# Patient Record
Sex: Female | Born: 1990 | Race: White | Hispanic: No | Marital: Married | Smoking: Former smoker
Health system: Southern US, Community
[De-identification: ages and names within clinical notes are randomized; demographics above are authoritative.]

## PROBLEM LIST (undated history)

## (undated) DIAGNOSIS — G8929 Other chronic pain: Secondary | ICD-10-CM

## (undated) DIAGNOSIS — M549 Dorsalgia, unspecified: Secondary | ICD-10-CM

## (undated) HISTORY — PX: WISDOM TOOTH EXTRACTION: SHX21

---

## 2009-10-14 ENCOUNTER — Inpatient Hospital Stay: Payer: Self-pay

## 2011-12-09 ENCOUNTER — Ambulatory Visit: Payer: Self-pay

## 2012-03-07 ENCOUNTER — Ambulatory Visit: Payer: Self-pay | Admitting: Unknown Physician Specialty

## 2012-03-15 ENCOUNTER — Other Ambulatory Visit: Payer: Self-pay | Admitting: Neurosurgery

## 2012-03-15 ENCOUNTER — Encounter (HOSPITAL_COMMUNITY): Payer: Self-pay | Admitting: Pharmacy Technician

## 2012-03-22 ENCOUNTER — Encounter (HOSPITAL_COMMUNITY): Payer: Self-pay

## 2012-03-22 ENCOUNTER — Encounter (HOSPITAL_COMMUNITY)
Admission: RE | Admit: 2012-03-22 | Discharge: 2012-03-22 | Disposition: A | Discharge status: Home / Self Care | Payer: Medicaid Other | Source: Ambulatory Visit | Attending: Neurosurgery | Admitting: Neurosurgery

## 2012-03-22 DIAGNOSIS — Z538 Procedure and treatment not carried out for other reasons: Secondary | ICD-10-CM | POA: Insufficient documentation

## 2012-03-22 DIAGNOSIS — Z01818 Encounter for other preprocedural examination: Secondary | ICD-10-CM | POA: Insufficient documentation

## 2012-03-22 DIAGNOSIS — Z01812 Encounter for preprocedural laboratory examination: Secondary | ICD-10-CM | POA: Insufficient documentation

## 2012-03-22 HISTORY — DX: Other chronic pain: G89.29

## 2012-03-22 HISTORY — DX: Other chronic pain: M54.9

## 2012-03-22 LAB — CBC
MCH: 30 pg (ref 26.0–34.0)
MCHC: 34.5 g/dL (ref 30.0–36.0)
Platelets: 259 10*3/uL (ref 150–400)
RDW: 13 % (ref 11.5–15.5)

## 2012-03-22 LAB — SURGICAL PCR SCREEN
MRSA, PCR: NEGATIVE
Staphylococcus aureus: NEGATIVE

## 2012-03-22 LAB — HCG, SERUM, QUALITATIVE: Preg, Serum: NEGATIVE

## 2012-03-22 NOTE — Pre-Procedure Instructions (Signed)
[address]  03/22/2012   Your procedure is scheduled on:  Tues, Oct 22 @ 2:15 PM  Report to [facility] at 11:15 AM.  Call this number if you have problems the morning of surgery: [phone]   Remember:   Do not eat food:After Midnight.  Take these medicines the morning of surgery with A SIP OF WATER: Pain Pill(if needed)   Do not wear jewelry, make-up or nail polish.  Do not wear lotions, powders, or perfumes. You may wear deodorant.  Do not shave 48 hours prior to surgery.   Do not bring valuables to the hospital.  Contacts, dentures or bridgework may not be worn into surgery.  Leave suitcase in the car. After surgery it may be brought to your room.  For patients admitted to the hospital, checkout time is 11:00 AM the day of discharge.   Patients discharged the day of surgery will not be allowed to drive home.  Special Instructions: Shower using CHG 2 nights before surgery and the night before surgery.  If you shower the day of surgery use CHG.  Use special wash - you have one bottle of CHG for all showers.  You should use approximately 1/3 of the bottle for each shower.   Please read over the following fact sheets that you were given: Pain Booklet, Coughing and Deep Breathing, MRSA Information and Surgical Site Infection Prevention

## 2012-03-22 NOTE — Progress Notes (Signed)
Pt doesn't have a  Cardiologist  Denies ever having an echo/heart cath/stress test   [facility] in [place]  Denies ekg or cxr

## 2012-03-26 MED ORDER — CEFAZOLIN SODIUM 10 G IJ SOLR
3.0000 g | INTRAMUSCULAR | Status: DC
  Filled 2012-03-26: qty 3000

## 2012-03-26 NOTE — H&P (Signed)
NEUROSURGICAL CONSULTATION  Theodis Aguas. Caralee Ates  #[id number]  DOB:  [birth year]    March 14, 2012   HISTORY OF PRESENT ILLNESS:  Rebekah Parks is a 21 year old woman who works as a part-time Arts administrator who complains of right leg pain radiating to her right ankle.  She says this has gradually been getting worse.  It began 10/2011.  She said she fell on 10/17/2011 on her right hip and now she says that driving causes her increased pain.  She says she has been taking Hydrocodone 7.5/500 up to 7 to 10 per day and is still not getting relief.  She says that after she fell in May that a week later she started having severe pain and this has gradually worsened.  She comes today with an MRI of her lumbar spine which was performed through [facility] on October 2nd, 2013 which shows a large right paracentral L5-S1 disc herniation with a large sequestered fragment and high-grade spinal stenosis.    Currently she denies bowel or bladder dysfunction.    REVIEW OF SYSTEMS:   Review of Systems was reviewed with the patient.  Pertinent positives include Cardiovascular - she notes leg pain with walking; Musculoskeletal - she notes leg pain.    PAST MEDICAL HISTORY:      Medications and Allergies:  Hydrocodone as previously described.  No known drug allergies.      Height and Weight:  She is 5', 5" tall and 210 lbs.  BMI is 34.9.    FAMILY HISTORY:    Mother was born in 54.  She has high blood pressure.  Father was born in 85.  He is otherwise healthy.    SOCIAL HISTORY:    She smokes 1  packs of cigarettes per day.  She is a social drinker of alcoholic beverages.  She denies any drug use.    PHYSICAL EXAMINATION:      General Appearance:  [name] is a pleasant, cooperative woman in no acute distress.      Blood Pressure, Pulse and Respiratory Rate:  Her blood pressure is 132/70.  Heart rate is 76 and regular.  Respiratory rate is 18.        HEENT - normocephalic, atraumatic.  The  pupils are equal, round and reactive to light.  The extraocular muscles are intact.  Sclerae - white.  Conjunctiva - pink.  Oropharynx benign.  Uvula midline.     Neck - there are no masses, meningismus, deformities, tracheal deviation, jugular vein distention or carotid bruits.  There is normal cervical range of motion.  Spurlings' test is negative without reproducible radicular pain turning the patient's head to either side.  Lhermitte's sign is not present with axial compression.      Respiratory - there is normal respiratory effort with good intercostal function.  Lungs are clear to auscultation.  There are no rales, rhonchi or wheezes.      Cardiovascular - the heart has regular rate and rhythm to auscultation.  No murmurs are appreciated.  There is no extremity edema, cyanosis or clubbing.  There are palpable pedal pulses.      Abdomen - obese, soft, nontender, no hepatosplenomegaly appreciated or masses.  There are active bowel sounds.  No guarding or rebound.      Musculoskeletal Examination - she has right sciatic notch discomfort.  She is able to bend to within 10 degrees off of vertical before she has significant back and right leg pain.  She is able  to stand on her heels and toes, but has decreased dorsiflexion on the right secondary to pain.  She has straight leg raise at 30 degrees on the right and positive cross straight leg raise on the left for right lower extremity pain.    NEUROLOGICAL EXAMINATION: The patient is oriented to time, person and place and has good recall of both recent and remote memory with normal attention span and concentration.  The patient speaks with clear and fluent speech and exhibits normal language function and appropriate fund of knowledge.      Cranial Nerve Examination - pupils are equal, round and reactive to light.  Extraocular movements are full.  Visual fields are full to confrontational testing.  Facial sensation and facial movement are symmetric and  intact.  Hearing is intact to finger rub.  Palate is upgoing.  Shoulder shrug is symmetric.  Tongue protrudes in the midline.      Motor Examination - motor strength is 5/5 in the bilateral deltoids, biceps, triceps, handgrips, wrist extensors, interosseous.  In the lower extremities motor strength is 5/5 with the exception of right EHL strength at 4/5.        Sensory Examination - normal to light touch and pinprick sensation in the upper and lower extremities.     Deep Tendon Reflexes - 2 in the biceps, triceps and brachioradialis, 2 at the knees, absent at the right ankle, 2 at the left ankle.  Great toes are downgoing to plantar stimulation.      Cerebellar Examination - normal coordination in upper and lower extremities and normal rapid alternating movements.  Romberg test is negative.    IMPRESSION AND RECOMMENDATIONS:   Rebekah Parks is a 21 year old woman with a large disc herniation at the L5-S1 level on the right.  I have recommended that based on the severity of her pain, weakness and imaging findings that she undergo expedited right L5-S1 microdiskectomy.  This has been scheduled for 03/27/2012.  Risks and benefits were discussed with the patient in detail and she wishes to proceed.    I reviewed the studies with the patient and went over her physical examination.  I reviewed surgical models and discussed the typical hospital course and operative and postoperative course and the potential risks and benefits of surgery.  The risks of surgery were discussed in detail and include, but are not limited to, the risks of anesthesia, blood loss and the possibility of hemorrhage, infection, damage to nerves, damage to blood vessels, injury to the lumbar nerve root causing either temporary or permanent leg pain, numbness, weakness.  There is potential for spinal fluid leak from dural tear.  There is the potential for post-laminectomy spondylolisthesis, recurrent disc ruptured quoted at approximately  10%, failure to relieve pain, worsening of pain, need for further surgery.    [facility]    Danae Orleans. Venetia Maxon, M.D.  JDS:sv cc: Dr. Erin Sons  Dr. Clayborn Bigness

## 2012-03-27 ENCOUNTER — Ambulatory Visit (HOSPITAL_COMMUNITY): Admission: RE | Admit: 2012-03-27 | Payer: Medicaid Other | Source: Ambulatory Visit | Admitting: Neurosurgery

## 2012-03-27 ENCOUNTER — Encounter (HOSPITAL_COMMUNITY): Admission: RE | Payer: Self-pay | Source: Ambulatory Visit

## 2012-03-27 SURGERY — LUMBAR LAMINECTOMY/DECOMPRESSION MICRODISCECTOMY 1 LEVEL
Anesthesia: General | Site: Back | Laterality: Right

## 2012-03-27 NOTE — Interval H&P Note (Signed)
History and Physical Interval Note:  03/27/2012 6:29 AM  Rebekah Parks  has presented today for surgery, with the diagnosis of Lumbar hnp without myelopathy, Lumbar spondylosis, Lumbar degenerative disc disease, Lumbar radiculopathy  The various methods of treatment have been discussed with the patient and family. After consideration of risks, benefits and other options for treatment, the patient has consented to  Procedure(s) (LRB) with comments: LUMBAR LAMINECTOMY/DECOMPRESSION MICRODISCECTOMY 1 LEVEL (Right) - Right L5-S1 Microdiskectomy as a surgical intervention .  The patient's history has been reviewed, patient examined, no change in status, stable for surgery.  I have reviewed the patient's chart and labs.  Questions were answered to the patient's satisfaction.     Rebekah Parks  Date of Initial H&P: 03/26/2012  History reviewed, patient examined, no change in status, stable for surgery.

## 2012-04-11 ENCOUNTER — Encounter: Payer: Self-pay | Admitting: Family Medicine

## 2012-05-06 ENCOUNTER — Encounter: Payer: Self-pay | Admitting: Family Medicine

## 2013-01-01 ENCOUNTER — Emergency Department: Payer: Self-pay | Admitting: Emergency Medicine

## 2013-01-04 LAB — BETA STREP CULTURE(ARMC)

## 2013-08-29 ENCOUNTER — Encounter: Payer: Self-pay | Admitting: Maternal & Fetal Medicine

## 2013-10-22 DIAGNOSIS — F1111 Opioid abuse, in remission: Secondary | ICD-10-CM | POA: Insufficient documentation

## 2013-11-21 DIAGNOSIS — E669 Obesity, unspecified: Secondary | ICD-10-CM | POA: Insufficient documentation

## 2013-11-21 DIAGNOSIS — E66812 Obesity, class 2: Secondary | ICD-10-CM | POA: Insufficient documentation

## 2015-03-12 ENCOUNTER — Ambulatory Visit
Admission: EM | Admit: 2015-03-12 | Discharge: 2015-03-12 | Disposition: A | Discharge status: Home / Self Care | Payer: Medicaid Other | Attending: Family Medicine | Admitting: Family Medicine

## 2015-03-12 DIAGNOSIS — Z283 Underimmunization status: Secondary | ICD-10-CM | POA: Diagnosis not present

## 2015-03-12 DIAGNOSIS — T148XXA Other injury of unspecified body region, initial encounter: Secondary | ICD-10-CM

## 2015-03-12 DIAGNOSIS — S6981XA Other specified injuries of right wrist, hand and finger(s), initial encounter: Secondary | ICD-10-CM | POA: Diagnosis not present

## 2015-03-12 DIAGNOSIS — Z7721 Contact with and (suspected) exposure to potentially hazardous body fluids: Secondary | ICD-10-CM | POA: Insufficient documentation

## 2015-03-12 DIAGNOSIS — S61239A Puncture wound without foreign body of unspecified finger without damage to nail, initial encounter: Secondary | ICD-10-CM

## 2015-03-12 DIAGNOSIS — F17200 Nicotine dependence, unspecified, uncomplicated: Secondary | ICD-10-CM | POA: Insufficient documentation

## 2015-03-12 DIAGNOSIS — Z2839 Other underimmunization status: Secondary | ICD-10-CM

## 2015-03-12 DIAGNOSIS — W273XXA Contact with needle (sewing), initial encounter: Secondary | ICD-10-CM

## 2015-03-12 DIAGNOSIS — T148 Other injury of unspecified body region: Secondary | ICD-10-CM | POA: Diagnosis not present

## 2015-03-12 DIAGNOSIS — IMO0001 Reserved for inherently not codable concepts without codable children: Secondary | ICD-10-CM

## 2015-03-12 LAB — COMPREHENSIVE METABOLIC PANEL
ALT: 16 U/L (ref 14–54)
AST: 18 U/L (ref 15–41)
Albumin: 4.1 g/dL (ref 3.5–5.0)
Alkaline Phosphatase: 58 U/L (ref 38–126)
Anion gap: 3 — ABNORMAL LOW (ref 5–15)
BUN: 7 mg/dL (ref 6–20)
CHLORIDE: 102 mmol/L (ref 101–111)
CO2: 32 mmol/L (ref 22–32)
Calcium: 9 mg/dL (ref 8.9–10.3)
Creatinine, Ser: 0.64 mg/dL (ref 0.44–1.00)
Glucose, Bld: 109 mg/dL — ABNORMAL HIGH (ref 65–99)
POTASSIUM: 3.8 mmol/L (ref 3.5–5.1)
SODIUM: 137 mmol/L (ref 135–145)
Total Bilirubin: 0.4 mg/dL (ref 0.3–1.2)
Total Protein: 6.8 g/dL (ref 6.5–8.1)

## 2015-03-12 LAB — PREGNANCY, URINE: PREG TEST UR: NEGATIVE

## 2015-03-12 LAB — CBC WITH DIFFERENTIAL/PLATELET
BASOS ABS: 0 10*3/uL (ref 0–0.1)
Basophils Relative: 1 %
EOS ABS: 0.2 10*3/uL (ref 0–0.7)
EOS PCT: 2 %
HCT: 40.3 % (ref 35.0–47.0)
Hemoglobin: 13.7 g/dL (ref 12.0–16.0)
LYMPHS ABS: 3.9 10*3/uL — AB (ref 1.0–3.6)
LYMPHS PCT: 46 %
MCH: 28.9 pg (ref 26.0–34.0)
MCHC: 34 g/dL (ref 32.0–36.0)
MCV: 84.9 fL (ref 80.0–100.0)
MONO ABS: 0.3 10*3/uL (ref 0.2–0.9)
Monocytes Relative: 4 %
Neutro Abs: 4 10*3/uL (ref 1.4–6.5)
Neutrophils Relative %: 47 %
PLATELETS: 261 10*3/uL (ref 150–440)
RBC: 4.74 MIL/uL (ref 3.80–5.20)
RDW: 13 % (ref 11.5–14.5)
WBC: 8.5 10*3/uL (ref 3.6–11.0)

## 2015-03-12 MED ORDER — EMTRICITABINE-TENOFOVIR DF 200-300 MG PO TABS: 1.0000 | ORAL_TABLET | Freq: Every day | ORAL | Status: DC

## 2015-03-12 MED ORDER — MUPIROCIN 2 % EX OINT: 1.0000 "application " | TOPICAL_OINTMENT | Freq: Three times a day (TID) | CUTANEOUS | Status: DC

## 2015-03-12 MED ORDER — DOLUTEGRAVIR SODIUM 50 MG PO TABS: 50.0000 mg | ORAL_TABLET | Freq: Every day | ORAL | Status: DC

## 2015-03-12 MED ORDER — TETANUS-DIPHTH-ACELL PERTUSSIS 5-2.5-18.5 LF-MCG/0.5 IM SUSP
0.5000 mL | Freq: Once | INTRAMUSCULAR | Status: AC
  Administered 2015-03-12: 0.5 mL via INTRAMUSCULAR

## 2015-03-12 NOTE — ED Provider Notes (Signed)
CSN: [id number]     Arrival date & time 03/12/15  1754 History   First MD Initiated Contact with Patient 03/12/15 1854    Nurse's notes were reviewed. Chief Complaint  Patient presents with  . Body Fluid Exposure   (Consider location/radiation/quality/duration/timing/severity/associated sxs/prior Treatment) Patient is a 24 y.o. female presenting with wound check. The history is provided by the patient. No language interpreter was used.  Wound Check This is a new problem. The current episode started 3 to 5 hours ago. The problem has not changed since onset.Pertinent negatives include no chest pain, no abdominal pain, no headaches and no shortness of breath. Nothing aggravates the symptoms. Nothing relieves the symptoms. She has tried nothing for the symptoms.   Patient is here after trying to take a needle away from a child she was stuck with a needle instead. The needle and syringe was insulin needle. No fluid in the needle was visible. The needle instruments found outside. Patient capped the needle immediately. She was wearing thick latex gloves at the time did receive a puncture wound to the web of her hand.  Initially she was not sure of her hepatitis B vaccination status but did find out that she did have the vaccination him when she had a baby they would've re-immunize her if she needed it at that time which she did. Her tetanus last that she knows of was 2002 since she will need to have a tetanus . Past Medical History  Diagnosis Date  . Chronic back pain     HNP,spondylosis/DDD/radiculopathy   Past Surgical History  Procedure Laterality Date  . Wisdom tooth extraction     No family history on file. Social History  Substance Use Topics  . Smoking status: Current Every Day Smoker -- 1.00 packs/day for 5 years  . Smokeless tobacco: Not on file  . Alcohol Use: No   OB History    No data available     Review of Systems  Respiratory: Negative for shortness of breath.    Cardiovascular: Negative for chest pain.  Gastrointestinal: Negative for abdominal pain.  Skin:       Puncture wound of the web of the right hand  Neurological: Negative for headaches.  All other systems reviewed and are negative.   Allergies  Review of patient's allergies indicates no known allergies.  Home Medications   Prior to Admission medications   Medication Sig Start Date End Date Taking? Authorizing Provider  buprenorphine-naloxone (SUBOXONE) 8-2 MG SUBL SL tablet Place 2.5 tablets under the tongue 2 (two) times daily.   Yes Historical Provider, MD  dolutegravir (TIVICAY) 50 MG tablet Take 1 tablet (50 mg total) by mouth daily. 03/12/15   Frederich Cha, MD  emtricitabine-tenofovir (TRUVADA) 200-300 MG tablet Take 1 tablet by mouth daily. TWO DRUG ADMINISTRATION NEED TO HAVE RETESTING IN TWO WEEKS 03/12/15 04/12/15  Frederich Cha, MD  mupirocin ointment (BACTROBAN) 2 % Apply 1 application topically 3 (three) times daily. 03/12/15   Frederich Cha, MD  oxyCODONE-acetaminophen (PERCOCET) 10-325 MG per tablet Take 1 tablet by mouth every 4 (four) hours as needed. For pain    Historical Provider, MD   Meds Ordered and Administered this Visit   Medications  Tdap (BOOSTRIX) injection 0.5 mL (0.5 mLs Intramuscular Given 03/12/15 2027)    BP 125/83 mmHg  Pulse 60  Temp(Src) 98 F (36.7 C) (Oral)  Resp 16  Wt 215 lb (97.523 kg)  SpO2 100%  LMP 02/24/2015 (Approximate) No data found.  Physical Exam  Constitutional: She is oriented to person, place, and time. She appears well-developed and well-nourished.  HENT:  Head: Normocephalic.  Eyes: Conjunctivae are normal. Pupils are equal, round, and reactive to light.  Musculoskeletal: Normal range of motion.  Neurological: She is alert and oriented to person, place, and time.  Skin: Skin is warm and dry.  Puncture wound present in the inside web of the right hand  Psychiatric: She has a normal mood and affect. Her behavior is normal.   Vitals reviewed.   ED Course  Procedures (including critical care time)  Labs Review Labs Reviewed  CBC WITH DIFFERENTIAL/PLATELET - Abnormal; Notable for the following:    Lymphs Abs 3.9 (*)    All other components within normal limits  COMPREHENSIVE METABOLIC PANEL - Abnormal; Notable for the following:    Glucose, Bld 109 (*)    Anion gap 3 (*)    All other components within normal limits  PREGNANCY, URINE  HEPATITIS B SURFACE ANTIGEN  HEPATITIS C ANTIBODY  HIV ANTIBODY (ROUTINE TESTING)    Imaging Review No results found.   Visual Acuity Review  Right Eye Distance:   Left Eye Distance:   Bilateral Distance:    Right Eye Near:   Left Eye Near:    Bilateral Near:         MDM   1. Needle stick injury of finger of right hand, initial encounter    Extensive time was spent trying to ascertain the latest recommendations for needle exposures. The ED was called because initially. She may need hepatitis prophylaxis as well for hepatitis B, nursing supervisors also consult with but eventually was found that patient did have her hepatitis B vaccination. Tetanus will be given. PEP line was googled after suggested by the nursing supervisor. Extensive conversation was then held with the PEP consultant. The consultant felt that the risk of HIV infection was low but did agree that the patient has to make the decision as far as using PEP and whether she would use 3 agents or 2. See below as far as the schedule was finally decided on for the patient and treatment.  Patient will be given option of treatment. In discussion with Is felt that a needle stick from outside source that long is probably going to be ineffective effective to transfer and HIV infection. Hepatitis B and hepatitis C a more harder viruses and have higher senses of survival however patient will be given option of using a 3 drug Regimen. The concern w/ the 3 pep REGIMEN is the toxicity to the drugs. Another option is  just take the Truvada which would give her to drug protection versus 3. If she continues to take this REGIMEN she needs recheck of liver enzymes in 2 weeks. She also will need follow-up at 6 weeks and and 12 weeks and 24 weeks for evaluation of hep C as well. This assumption is based on her having hepatitis B vaccination active status. Also she may have to call the [place] drug assistance program in the morning at [phone] and if they need to reach me to talk about getting help for the medication she'll have to call the urgent care after 2:00 and probably before 4:00 in the evening to get my input as well.  This regimen was discussed in front of the nurse Chauncey Fischer RN.     Frederich Cha, MD 03/12/15 2109

## 2015-03-12 NOTE — Discharge Instructions (Signed)
Patient will be given option of treatment. In discussion with Is felt that a needle stick from outside source that long is probably going to be ineffective effective to transfer and HIV infection. Hepatitis B and hepatitis C a more harder viruses and have higher senses of survival however patient will be given option of using a 3 drug Regimen. The concern w/ the 3 pep REGIMEN is the toxicity to the drugs. Another option is just take the Truvada which would give her to drug protection versus 3. If she continues to take this REGIMEN she needs recheck of liver enzymes in 2 weeks. She also will need follow-up at 6 weeks and and 12 weeks and 24 weeks for evaluation of hep C as well. This assumption is based on her having hepatitis B vaccination active status. Also she may have to call the [place] drug assistance program in the morning at [phone] and if they need to reach me to talk about getting help for the medication she'll have to call the urgent care after 2:00 and probably before 4:00 in the evening to get my input as well.  Needle Stick Injury  A needle stick injury happens when you are stuck by a needle that may have another person's blood on it. Diseases may be passed this way.  HOME CARE  Take medicine exactly as told by your doctor.  Keep all doctor visits as told.  Do not share personal items. GET HELP RIGHT AWAY IF:  You have concerns about your injury, treatment, or follow-up.  The injury is red, puffy (swollen), or painful.  There is yellowish-white fluid (pus) coming from the injury. MAKE SURE YOU:  Understand these instructions.  Will watch your condition.  Will get help right away if you are not doing well or get worse.   This information is not intended to replace advice given to you by your health care provider. Make sure you discuss any questions you have with your health care provider.   Document Released: 06/25/2010 Document Revised: 08/15/2011 Document Reviewed:  12/10/2014 Elsevier Interactive Patient Education [employer].

## 2015-03-12 NOTE — ED Notes (Addendum)
Non-toxic appearing pt.   "Needle stick" site to right web of the thumb area.  Small pinpoint dot noted to where pt points. No current bleeding.

## 2015-03-12 NOTE — ED Notes (Signed)
Reports getting "barely" poked by a needle.  Was [place], found by kids and brought to patient.   Immediately washed with soap, water, and alcohol.   Site is to right snuffbox area.

## 2015-03-14 LAB — HEPATITIS C ANTIBODY: HCV Ab: <0.1 s/co ratio (ref 0.0–0.9)

## 2015-03-14 LAB — HEPATITIS B SURFACE ANTIGEN: Hepatitis B Surface Ag: NEGATIVE

## 2015-03-14 LAB — HIV ANTIBODY (ROUTINE TESTING W REFLEX): HIV Screen 4th Generation wRfx: NONREACTIVE

## 2015-03-29 ENCOUNTER — Ambulatory Visit
Admission: EM | Admit: 2015-03-29 | Discharge: 2015-03-29 | Disposition: A | Discharge status: Home / Self Care | Payer: Medicaid Other | Attending: Family Medicine | Admitting: Family Medicine

## 2015-03-30 ENCOUNTER — Ambulatory Visit
Admission: EM | Admit: 2015-03-30 | Discharge: 2015-03-30 | Disposition: A | Discharge status: Home / Self Care | Payer: Medicaid Other | Attending: Family Medicine | Admitting: Family Medicine

## 2015-03-30 DIAGNOSIS — R300 Dysuria: Secondary | ICD-10-CM | POA: Diagnosis present

## 2015-03-30 DIAGNOSIS — N390 Urinary tract infection, site not specified: Secondary | ICD-10-CM | POA: Diagnosis not present

## 2015-03-30 LAB — URINALYSIS COMPLETE WITH MICROSCOPIC (ARMC ONLY)
Glucose, UA: NEGATIVE mg/dL
KETONES UR: NEGATIVE mg/dL
Nitrite: POSITIVE — AB
PH: 5.5 (ref 5.0–8.0)
PROTEIN: 100 mg/dL — AB
Specific Gravity, Urine: 1.015 (ref 1.005–1.030)

## 2015-03-30 LAB — RAPID INFLUENZA A&B ANTIGENS: Influenza A (ARMC): NOT DETECTED

## 2015-03-30 LAB — RAPID INFLUENZA A&B ANTIGENS (ARMC ONLY): INFLUENZA B (ARMC): NOT DETECTED

## 2015-03-30 LAB — PREGNANCY, URINE: PREG TEST UR: NEGATIVE

## 2015-03-30 MED ORDER — SULFAMETHOXAZOLE-TRIMETHOPRIM 800-160 MG PO TABS: 1.0000 | ORAL_TABLET | Freq: Two times a day (BID) | ORAL | Status: DC

## 2015-03-30 MED ORDER — ACETAMINOPHEN 500 MG PO TABS: 1000.0000 mg | ORAL_TABLET | Freq: Four times a day (QID) | ORAL | Status: DC | PRN

## 2015-03-30 MED ORDER — ACETAMINOPHEN 500 MG PO TABS
1000.0000 mg | ORAL_TABLET | Freq: Once | ORAL | Status: AC
  Administered 2015-03-30: 1000 mg via ORAL

## 2015-03-30 MED ORDER — PHENAZOPYRIDINE HCL 200 MG PO TABS
200.0000 mg | ORAL_TABLET | Freq: Three times a day (TID) | ORAL | Status: DC
Start: 2015-03-30 — End: 2018-07-27

## 2015-03-30 NOTE — ED Notes (Signed)
Patient states that she was given this medicine on Oct. 6 and developed burning when urinating and some blood in her urine starting on Thursday.  Patient states that her symptoms stopped yesterday.  Patient reports ongoing back pain.

## 2015-03-30 NOTE — ED Provider Notes (Addendum)
CSN: [id number]     Arrival date & time 03/30/15  1040 History   First MD Initiated Contact with Patient 03/30/15 1113     Chief Complaint  Patient presents with  . Dysuria  . Fever  . Muscle Pain   (Consider location/radiation/quality/duration/timing/severity/associated sxs/prior Treatment) HPI Comments: Married caucasian female here for evaluation of burning end of urination, urine orange colored, blood tinged started 20 Oct.  Not using birth control with husband.  Low back pain, headache.  Also requesting labs with Dr Alveta Heimlich for antiviral medication she was given 6 Oct stopped medication on Friday 21 Oct.  The history is provided by the patient.    Past Medical History  Diagnosis Date  . Chronic back pain     HNP,spondylosis/DDD/radiculopathy   Past Surgical History  Procedure Laterality Date  . Wisdom tooth extraction     No family history on file. Social History  Substance Use Topics  . Smoking status: Current Every Day Smoker -- 1.00 packs/day for 5 years  . Smokeless tobacco: Not on file  . Alcohol Use: No   OB History    No data available     Review of Systems  Constitutional: Positive for fever, chills and diaphoresis. Negative for activity change, appetite change, fatigue and unexpected weight change.  HENT: Negative for congestion, dental problem, drooling, ear discharge, ear pain, facial swelling, hearing loss, mouth sores, nosebleeds, postnasal drip, rhinorrhea, sinus pressure, sneezing, sore throat, tinnitus, trouble swallowing and voice change.   Eyes: Negative for photophobia, pain, discharge, redness, itching and visual disturbance.  Respiratory: Negative for cough, choking, chest tightness, shortness of breath, wheezing and stridor.   Cardiovascular: Negative for chest pain, palpitations and leg swelling.  Gastrointestinal: Negative for nausea, vomiting, abdominal pain, diarrhea, constipation, blood in stool and abdominal distention.  Endocrine: Negative for  cold intolerance and heat intolerance.  Genitourinary: Positive for dysuria, urgency, frequency and hematuria. Negative for flank pain, decreased urine volume, vaginal bleeding, vaginal discharge, enuresis, difficulty urinating, genital sores, vaginal pain, menstrual problem and pelvic pain.  Musculoskeletal: Positive for back pain. Negative for myalgias, joint swelling, arthralgias, gait problem, neck pain and neck stiffness.  Skin: Negative for color change, pallor, rash and wound.  Allergic/Immunologic: Negative for environmental allergies and food allergies.  Neurological: Negative for dizziness, tremors, seizures, syncope, facial asymmetry, speech difficulty, weakness, light-headedness, numbness and headaches.  Hematological: Negative for adenopathy. Does not bruise/bleed easily.  Psychiatric/Behavioral: Negative for behavioral problems, confusion, sleep disturbance and agitation.    Allergies  Review of patient's allergies indicates no known allergies.  Home Medications   Prior to Admission medications   Medication Sig Start Date End Date Taking? Authorizing Provider  acetaminophen (TYLENOL) 500 MG tablet Take 2 tablets (1,000 mg total) by mouth every 6 (six) hours as needed for moderate pain, fever or headache. 03/30/15   Olen Cordial, NP  buprenorphine-naloxone (SUBOXONE) 8-2 MG SUBL SL tablet Place 2.5 tablets under the tongue 2 (two) times daily.    Historical Provider, MD  phenazopyridine (PYRIDIUM) 200 MG tablet Take 1 tablet (200 mg total) by mouth 3 (three) times daily. 03/30/15   Olen Cordial, NP  sulfamethoxazole-trimethoprim (BACTRIM DS,SEPTRA DS) 800-160 MG tablet Take 1 tablet by mouth 2 (two) times daily. 03/30/15   Olen Cordial, NP   Meds Ordered and Administered this Visit   Medications  acetaminophen (TYLENOL) tablet 1,000 mg (1,000 mg Oral Given 03/30/15 1119)    BP 124/77 mmHg  Pulse 127  Temp(Src) 101.2  F (38.4 C) (Tympanic)  Resp 16  Ht 5'  5" (1.651 m)  Wt 215 lb (97.523 kg)  BMI 35.78 kg/m2  SpO2 97%  LMP 02/24/2015 (Approximate) No data found.   Physical Exam  Constitutional: She is oriented to person, place, and time. Vital signs are normal. She appears well-developed and well-nourished. She is active and cooperative.  Non-toxic appearance. She does not have a sickly appearance. She does not appear ill. No distress.  HENT:  Head: Normocephalic and atraumatic.  Right Ear: Hearing, tympanic membrane, external ear and ear canal normal.  Left Ear: Hearing, tympanic membrane, external ear and ear canal normal.  Nose: Nose normal. No mucosal edema, rhinorrhea, nose lacerations, sinus tenderness, nasal deformity, septal deviation or nasal septal hematoma. No epistaxis.  No foreign bodies. Right sinus exhibits no maxillary sinus tenderness and no frontal sinus tenderness. Left sinus exhibits no maxillary sinus tenderness and no frontal sinus tenderness.  Mouth/Throat: Uvula is midline, oropharynx is clear and moist and mucous membranes are normal. Mucous membranes are not pale, not dry and not cyanotic. She does not have dentures. No oral lesions. No trismus in the jaw. Normal dentition. No dental abscesses, uvula swelling, lacerations or dental caries. No oropharyngeal exudate, posterior oropharyngeal edema, posterior oropharyngeal erythema or tonsillar abscesses.  Eyes: Conjunctivae, EOM and lids are normal. Pupils are equal, round, and reactive to light. Right eye exhibits no chemosis, no discharge, no exudate and no hordeolum. No foreign body present in the right eye. Left eye exhibits no chemosis, no discharge, no exudate and no hordeolum. No foreign body present in the left eye. Right conjunctiva is not injected. Right conjunctiva has no hemorrhage. Left conjunctiva is not injected. Left conjunctiva has no hemorrhage. No scleral icterus. Right eye exhibits normal extraocular motion and no nystagmus. Left eye exhibits normal  extraocular motion and no nystagmus. Right pupil is round and reactive. Left pupil is round and reactive. Pupils are equal.  Neck: Trachea normal and normal range of motion. Neck supple. No tracheal tenderness, no spinous process tenderness and no muscular tenderness present. No rigidity. No tracheal deviation, no edema, no erythema and normal range of motion present. No thyroid mass and no thyromegaly present.  Cardiovascular: Normal rate, regular rhythm, S1 normal, S2 normal, normal heart sounds and intact distal pulses.  PMI is not displaced.  Exam reveals no gallop and no friction rub.   No murmur heard. Pulses:      Radial pulses are 2+ on the right side, and 2+ on the left side.  Pulmonary/Chest: Effort normal and breath sounds normal. No accessory muscle usage or stridor. No respiratory distress. She has no decreased breath sounds. She has no wheezes. She has no rhonchi. She has no rales. She exhibits no tenderness.  Abdominal: Soft. Bowel sounds are normal. She exhibits no shifting dullness, no distension, no pulsatile liver, no fluid wave, no abdominal bruit, no ascites, no pulsatile midline mass and no mass. There is no hepatosplenomegaly. There is no tenderness. There is no rigidity, no rebound, no guarding, no CVA tenderness, no tenderness at McBurney's point and negative Murphy's sign. Hernia confirmed negative in the ventral area.  Dull to percussion x 4 quads  Musculoskeletal: Normal range of motion. She exhibits no edema or tenderness.       Right shoulder: Normal.       Left shoulder: Normal.       Right elbow: Normal.      Left elbow: Normal.  Right hip: Normal.       Left hip: Normal.       Right knee: Normal.       Left knee: Normal.       Cervical back: Normal.       Thoracic back: Normal.       Lumbar back: She exhibits pain. She exhibits normal range of motion, no tenderness, no bony tenderness, no swelling, no edema, no deformity, no laceration, no spasm and normal  pulse.       Back:       Right hand: Normal.       Left hand: Normal.  Lymphadenopathy:       Head (right side): No submental, no submandibular, no tonsillar, no preauricular, no posterior auricular and no occipital adenopathy present.       Head (left side): No submental, no submandibular, no tonsillar, no preauricular, no posterior auricular and no occipital adenopathy present.    She has no cervical adenopathy.       Right cervical: No superficial cervical, no deep cervical and no posterior cervical adenopathy present.      Left cervical: No superficial cervical, no deep cervical and no posterior cervical adenopathy present.  Neurological: She is alert and oriented to person, place, and time. She displays no atrophy and no tremor. No cranial nerve deficit or sensory deficit. She exhibits normal muscle tone. She displays no seizure activity. Coordination and gait normal. GCS eye subscore is 4. GCS verbal subscore is 5. GCS motor subscore is 6.  Skin: Skin is warm, dry and intact. No abrasion, no bruising, no burn, no ecchymosis, no laceration, no lesion, no petechiae and no rash noted. She is not diaphoretic. No cyanosis or erythema. No pallor. Nails show no clubbing.  Psychiatric: She has a normal mood and affect. Her speech is normal and behavior is normal. Judgment and thought content normal. Cognition and memory are normal.  Nursing note and vitals reviewed.   ED Course  Procedures (including critical care time)  Labs Review Labs Reviewed  URINALYSIS COMPLETEWITH MICROSCOPIC ([facility] ONLY) - Abnormal; Notable for the following:    Color, Urine AMBER (*)    APPearance HAZY (*)    Bilirubin Urine 1+ (*)    Hgb urine dipstick 2+ (*)    Protein, ur 100 (*)    Nitrite POSITIVE (*)    Leukocytes, UA 2+ (*)    Bacteria, UA MANY (*)    Squamous Epithelial / LPF 0-5 (*)    All other components within normal limits  RAPID INFLUENZA A&B ANTIGENS ([facility] ONLY)  URINE CULTURE  PREGNANCY,  URINE    Imaging Review No results found.  1148 discussed with patient urinalysis results abnormal blood, bacteria, protein, leukocytes.  Negative for rapid flu.  Start antibiotic bactrim ds po BID upon receipt from pharmacy 2 doses today.  Pyridium 200mg  po TID prn burning with urination x 2 days.  Follow up for re-evaluation if no improvement with 4 doses of antibiotic fever should be decreased/gone along with burning with urination.  Follow up with [facility] for repeat urinalysis in 2 weeks.  Follow up with Dr Alveta Heimlich or [facility] for LFTs s/p needle stick/antivirals approval not received from [facility] for lab draw today.  Feeling better after tylenol administered by RN Tula Nakayama 1000mg  po x 1 at 1119.  Patient verbalized understanding of information/instructions, agreed with plan of care and had no further questions at this time.  65 Message left for patient pregnancy test negative/normal  may start antibiotic.    MDM   1. UTI (lower urinary tract infection)    Medications as directed. Rx bactrim ds po BID x 7 days. Copy of urinalysis results given to patient and discussed.  Patient is also to push fluids and may use Pyridium 200mg  po TID as needed.  Hydrate, avoid dehydration.  Avoid holding urine void on frequent basis every 4 to 6 hours.  If unable to void every 8 hours follow up for re-evaluation with PCM, urgent care or ER.  ER if tea colored urine, unable to void despite po hydration, urinating large blood clots.  Call or return to clinic as needed if these symptoms worsen or fail to improve as anticipated.  Exitcare handout on cystitis given to patient Patient verbalized agreement and understanding of treatment plan and had no further questions at this time. P2:  Hydrate and cranberry juice   Olen Cordial, NP 03/30/15 1359  28 Oct at [place] Telephone message left for patient to contact clinic to verify if symptoms resolved and to verify message receipt/understanding urine culture results  e. Coli not resistant to any antibiotics.  Olen Cordial, NP 04/03/15 [phone]

## 2015-03-30 NOTE — Discharge Instructions (Signed)
Hematuria, Adult °Hematuria is blood in your urine. It can be caused by a bladder infection, kidney infection, prostate infection, kidney stone, or cancer of your urinary tract. Infections can usually be treated with medicine, and a kidney stone usually will pass through your urine. If neither of these is the cause of your hematuria, further workup to find out the reason may be needed. °It is very important that you tell your health care provider about any blood you see in your urine, even if the blood stops without treatment or happens without causing pain. Blood in your urine that happens and then stops and then happens again can be a symptom of a very serious condition. Also, pain is not a symptom in the initial stages of many urinary cancers. °HOME CARE INSTRUCTIONS  °· Drink lots of fluid, 3-4 quarts a day. If you have been diagnosed with an infection, cranberry juice is especially recommended, in addition to large amounts of water. °· Avoid caffeine, tea, and carbonated beverages because they tend to irritate the bladder. °· Avoid alcohol because it may irritate the prostate. °· Take all medicines as directed by your health care provider. °· If you were prescribed an antibiotic medicine, finish it all even if you start to feel better. °· If you have been diagnosed with a kidney stone, follow your health care provider's instructions regarding straining your urine to catch the stone. °· Empty your bladder often. Avoid holding urine for long periods of time. °· After a bowel movement, women should cleanse front to back. Use each tissue only once. °· Empty your bladder before and after sexual intercourse if you are a female. °SEEK MEDICAL CARE IF: °· You develop back pain. °· You have a fever. °· You have a feeling of sickness in your stomach (nausea) or vomiting. °· Your symptoms are not better in 3 days. Return sooner if you are getting worse. °SEEK IMMEDIATE MEDICAL CARE IF:  °· You develop severe vomiting and  are unable to keep the medicine down. °· You develop severe back or abdominal pain despite taking your medicines. °· You begin passing a large amount of blood or clots in your urine. °· You feel extremely weak or faint, or you pass out. °MAKE SURE YOU:  °· Understand these instructions. °· Will watch your condition. °· Will get help right away if you are not doing well or get worse. °  °This information is not intended to replace advice given to you by your health care provider. Make sure you discuss any questions you have with your health care provider. °  °Document Released: 05/23/2005 Document Revised: 06/13/2014 Document Reviewed: 01/21/2013 °Elsevier Interactive Patient Education ©2016 [employer] °Proteinuria °Proteinuria is a condition in which urine contains more protein than is normal. Proteinuria is either a sign that your body is producing too much protein or a sign that there is a problem with the kidneys. Healthy kidneys prevent most substances that the body needs, including proteins, from leaving the bloodstream and ending up in urine. °CAUSES  °Proteinuria may be caused by a temporary event or condition such as stress, exercise, or fever, and go away on its own. Proteinuria may also be a symptom of a more serious condition or disease. Causes of proteinuria include: °· A kidney disease caused by: °· Diabetes. °· High blood pressure (hypertension).   °· A disease that affects the immune system, such as lupus. °· A genetic disease, such as Alport's syndrome. °· Medicines that damage the kidneys, such   as long-term nonsteroidal anti-inflammatory drugs (NSAIDs).  Poisoning or exposure to toxic substances.  A reoccurring kidney or urinary infection.  Excess protein production in the body caused by:  Multiple myeloma.  Amyloidosis. SYMPTOMS You may have proteinuria without having noticeable symptoms. If there is a large amount of protein in your urine, your urine may look foamy. You may also  notice swelling (edema) in your hands, feet, abdomen, or face. DIAGNOSIS To determine whether you have proteinuria, you will need to provide a urine sample. Your urine will then be tested for too much protein and the main blood protein albumin. If your test shows that you have proteinuria, you may need to take additional tests to determine its cause, how much protein is in your urine, and what type of protein is being lost. Tests may include:  Blood tests.  Urine tests.  A blood pressure measurement.  Imaging tests. TREATMENT  Treatment will depend on the cause of your proteinuria. Your caregiver will discuss treatment options with you after you have been diagnosed. If your proteinuria is mild or temporary, no treatment may be necessary. HOME CARE INSTRUCTIONS Ask your caregiver if monitoring the level of protein in your urine at home using simple testing strips is appropriate for you. Early detection of proteinuria can lead to early and often successful treatment of the condition causing it.   This information is not intended to replace advice given to you by your health care provider. Make sure you discuss any questions you have with your health care provider.   Document Released: 07/13/2005 Document Revised: 02/15/2012 Document Reviewed: 10/21/2011 Elsevier Interactive Patient Education 2016 [employer] Urinary Tract Infection Urinary tract infections (UTIs) can develop anywhere along your urinary tract. Your urinary tract is your body's drainage system for removing wastes and extra water. Your urinary tract includes two kidneys, two ureters, a bladder, and a urethra. Your kidneys are a pair of bean-shaped organs. Each kidney is about the size of your fist. They are located below your ribs, one on each side of your spine. CAUSES Infections are caused by microbes, which are microscopic organisms, including fungi, viruses, and bacteria. These organisms are so small that they can only be  seen through a microscope. Bacteria are the microbes that most commonly cause UTIs. SYMPTOMS  Symptoms of UTIs may vary by age and gender of the patient and by the location of the infection. Symptoms in young women typically include a frequent and intense urge to urinate and a painful, burning feeling in the bladder or urethra during urination. Older women and men are more likely to be tired, shaky, and weak and have muscle aches and abdominal pain. A fever may mean the infection is in your kidneys. Other symptoms of a kidney infection include pain in your back or sides below the ribs, nausea, and vomiting. DIAGNOSIS To diagnose a UTI, your caregiver will ask you about your symptoms. Your caregiver will also ask you to provide a urine sample. The urine sample will be tested for bacteria and white blood cells. White blood cells are made by your body to help fight infection. TREATMENT  Typically, UTIs can be treated with medication. Because most UTIs are caused by a bacterial infection, they usually can be treated with the use of antibiotics. The choice of antibiotic and length of treatment depend on your symptoms and the type of bacteria causing your infection. HOME CARE INSTRUCTIONS  If you were prescribed antibiotics, take them exactly as your caregiver instructs you.  Finish the medication even if you feel better after you have only taken some of the medication. °· Drink enough water and fluids to keep your urine clear or pale yellow. °· Avoid caffeine, tea, and carbonated beverages. They tend to irritate your bladder. °· Empty your bladder often. Avoid holding urine for long periods of time. °· Empty your bladder before and after sexual intercourse. °· After a bowel movement, women should cleanse from front to back. Use each tissue only once. °SEEK MEDICAL CARE IF:  °· You have back pain. °· You develop a fever. °· Your symptoms do not begin to resolve within 3 days. °SEEK IMMEDIATE MEDICAL CARE IF:   °· You have severe back pain or lower abdominal pain. °· You develop chills. °· You have nausea or vomiting. °· You have continued burning or discomfort with urination. °MAKE SURE YOU:  °· Understand these instructions. °· Will watch your condition. °· Will get help right away if you are not doing well or get worse. °  °This information is not intended to replace advice given to you by your health care provider. Make sure you discuss any questions you have with your health care provider. °  °Document Released: 03/02/2005 Document Revised: 02/11/2015 Document Reviewed: 07/01/2011 °Elsevier Interactive Patient Education ©2016 [employer] ° °

## 2015-04-01 LAB — URINE CULTURE: SPECIAL REQUESTS: NORMAL

## 2015-04-10 LAB — INFLUENZA VIRUS AG, A+B (DFA)

## 2015-12-28 DIAGNOSIS — O99210 Obesity complicating pregnancy, unspecified trimester: Secondary | ICD-10-CM | POA: Insufficient documentation

## 2015-12-28 DIAGNOSIS — O99320 Drug use complicating pregnancy, unspecified trimester: Secondary | ICD-10-CM | POA: Insufficient documentation

## 2015-12-28 DIAGNOSIS — Z8279 Family history of other congenital malformations, deformations and chromosomal abnormalities: Secondary | ICD-10-CM

## 2015-12-28 HISTORY — DX: Family history of other congenital malformations, deformations and chromosomal abnormalities: Z82.79

## 2016-02-15 DIAGNOSIS — Z6791 Unspecified blood type, Rh negative: Secondary | ICD-10-CM

## 2016-02-15 HISTORY — DX: Unspecified blood type, rh negative: Z67.91

## 2016-04-16 DIAGNOSIS — O09899 Supervision of other high risk pregnancies, unspecified trimester: Secondary | ICD-10-CM | POA: Insufficient documentation

## 2016-06-21 DIAGNOSIS — O99820 Streptococcus B carrier state complicating pregnancy: Secondary | ICD-10-CM

## 2016-06-21 HISTORY — DX: Streptococcus B carrier state complicating pregnancy: O99.820

## 2018-07-27 ENCOUNTER — Other Ambulatory Visit: Payer: Self-pay

## 2018-07-27 ENCOUNTER — Ambulatory Visit
Admission: EM | Admit: 2018-07-27 | Discharge: 2018-07-27 | Disposition: A | Discharge status: Home / Self Care | Payer: Medicaid Other | Attending: Family Medicine | Admitting: Family Medicine

## 2018-07-27 ENCOUNTER — Encounter: Payer: Self-pay | Admitting: Emergency Medicine

## 2018-07-27 DIAGNOSIS — H6981 Other specified disorders of Eustachian tube, right ear: Secondary | ICD-10-CM

## 2018-07-27 MED ORDER — AMOXICILLIN-POT CLAVULANATE 875-125 MG PO TABS: 1.0000 | ORAL_TABLET | Freq: Two times a day (BID) | ORAL | 0 refills | Status: DC

## 2018-07-27 MED ORDER — CETIRIZINE-PSEUDOEPHEDRINE ER 5-120 MG PO TB12: 1.0000 | ORAL_TABLET | Freq: Every day | ORAL | 0 refills | Status: DC

## 2018-07-27 MED ORDER — FLUTICASONE PROPIONATE 50 MCG/ACT NA SUSP: 2.0000 | Freq: Every day | NASAL | 0 refills | Status: DC

## 2018-07-27 NOTE — Discharge Instructions (Addendum)
Flonase daily 2 sprays each nostril for 2 to 3 weeks.  May also use Zyrtec-D daily as a decongestant.  Not improving follow-up with  ear nose and throat

## 2018-07-27 NOTE — ED Triage Notes (Signed)
Patient c/o right ear pain and nasal congestion for the past 3 days.

## 2018-07-27 NOTE — ED Provider Notes (Signed)
[facility]    CSN: [id number] Arrival date & time: 07/27/18  1526     History   Chief Complaint Chief Complaint  Patient presents with  . Otalgia    APPT  . Nasal Congestion    HPI Rebekah Parks is a 28 y.o. female.   HPI  Female presents with right ear pain and nasal congestion for the past 3 days.  He said the ear pain is more towards the tragus.  The muffling sound in her ear as well.  She likens it to holding her hand over her ear outside.  Had the same thing happened 3 to 4 weeks ago when her kids were sick with colds but this seemed to resolve until just recently.  She is also complaining of congestion in her chest that is not actually from coughing.  She has very thick mucus type congestion.  No fever or chills.         Past Medical History:  Diagnosis Date  . Chronic back pain    HNP,spondylosis/DDD/radiculopathy    There are no active problems to display for this patient.   Past Surgical History:  Procedure Laterality Date  . WISDOM TOOTH EXTRACTION      OB History   No obstetric history on file.      Home Medications    Prior to Admission medications   Medication Sig Start Date End Date Taking? Authorizing Provider  buprenorphine-naloxone (SUBOXONE) 8-2 MG SUBL SL tablet Place 2.5 tablets under the tongue 2 (two) times daily.   Yes [provider]  acetaminophen (TYLENOL) 500 MG tablet Take 2 tablets (1,000 mg total) by mouth every 6 (six) hours as needed for moderate pain, fever or headache. 03/30/15   Betancourt, Aura Fey, NP  amoxicillin-clavulanate (AUGMENTIN) 875-125 MG tablet Take 1 tablet by mouth every 12 (twelve) hours. 07/27/18   Lorin Picket, PA-C  cetirizine-pseudoephedrine (ZYRTEC-D) 5-120 MG tablet Take 1 tablet by mouth daily. 07/27/18   Lorin Picket, PA-C  fluticasone (FLONASE) 50 MCG/ACT nasal spray Place 2 sprays into both nostrils daily. 07/27/18   Lorin Picket, PA-C    Family History Family  History  Problem Relation Age of Onset  . Healthy Mother   . Healthy Father     Social History Social History   Tobacco Use  . Smoking status: Former Smoker    Packs/day: 1.00    Years: 5.00    Pack years: 5.00  . Smokeless tobacco: Never Used  Substance Use Topics  . Alcohol use: No  . Drug use: Not Currently    Types: Marijuana    Comment: last time 3 1/45yrs ago     Allergies   Patient has no known allergies.   Review of Systems Review of Systems  Constitutional: Negative for activity change, appetite change, chills, fatigue and fever.  HENT: Positive for ear discharge, ear pain and postnasal drip.   All other systems reviewed and are negative.    Physical Exam Triage Vital Signs ED Triage Vitals  Enc Vitals Group     BP 07/27/18 1543 137/88     Pulse Rate 07/27/18 1543 80     Resp 07/27/18 1543 16     Temp 07/27/18 1543 98.6 F (37 C)     Temp Source 07/27/18 1543 Oral     SpO2 07/27/18 1543 100 %     Weight 07/27/18 1540 260 lb (117.9 kg)     Height 07/27/18 1540 5\' 5"  (1.651 m)  Head Circumference --      Peak Flow --      Pain Score 07/27/18 1540 0     Pain Loc --      Pain Edu? --      Excl. in [place]? --    No data found.  Updated Vital Signs BP 137/88 (BP Location: Left Arm)   Pulse 80   Temp 98.6 F (37 C) (Oral)   Resp 16   Ht 5\' 5"  (1.651 m)   Wt 260 lb (117.9 kg)   LMP 07/06/2018 (Approximate)   SpO2 100%   BMI 43.27 kg/m   Visual Acuity Right Eye Distance:   Left Eye Distance:   Bilateral Distance:    Right Eye Near:   Left Eye Near:    Bilateral Near:     Physical Exam Vitals signs and nursing note reviewed.  Constitutional:      General: She is not in acute distress.    Appearance: Normal appearance. She is not ill-appearing, toxic-appearing or diaphoretic.  HENT:     Head: Normocephalic and atraumatic.     Comments: Right ear shows the TM to be slightly erythematous but with a moderate amount of effusion present.   TM appears intact.  There is no drainage in the canal.    Left Ear: Tympanic membrane, ear canal and external ear normal.     Nose: Nose normal. No congestion or rhinorrhea.     Mouth/Throat:     Mouth: Mucous membranes are moist.     Pharynx: Oropharynx is clear.  Eyes:     General:        Right eye: No discharge.        Left eye: No discharge.     Conjunctiva/sclera: Conjunctivae normal.  Neck:     Musculoskeletal: Normal range of motion and neck supple.  Pulmonary:     Effort: Pulmonary effort is normal.     Breath sounds: Normal breath sounds.  Musculoskeletal: Normal range of motion.  Skin:    General: Skin is warm and dry.  Neurological:     General: No focal deficit present.     Mental Status: She is alert and oriented to person, place, and time.  Psychiatric:        Mood and Affect: Mood normal.        Behavior: Behavior normal.        Thought Content: Thought content normal.        Judgment: Judgment normal.      UC Treatments / Results  Labs (all labs ordered are listed, but only abnormal results are displayed) Labs Reviewed - No data to display  EKG None  Radiology No results found.  Procedures Procedures (including critical care time)  Medications Ordered in UC Medications - No data to display  Initial Impression / Assessment and Plan / UC Course  I have reviewed the triage vital signs and the nursing notes.  Pertinent labs & imaging results that were available during my care of the patient were reviewed by me and considered in my medical decision making (see chart for details).   Told the patient she likely has a eustachian tube dysfunction accounting for her symptoms.  Place her on Flonase nasal spray for 2 to 3 weeks.  Also consider taking a Zyrtec-D for its decongestant effects.  Not improving she should follow-up with [facility].   Final Clinical Impressions(s) / UC Diagnoses   Final diagnoses:  Eustachian tube dysfunction,  right     Discharge Instructions     Flonase daily 2 sprays each nostril for 2 to 3 weeks.  May also use Zyrtec-D daily as a decongestant.  Not improving follow-up with [facility]    ED Prescriptions    Medication Sig Dispense Auth. Provider   fluticasone (FLONASE) 50 MCG/ACT nasal spray Place 2 sprays into both nostrils daily. 16 g Crecencio Mc P, PA-C   cetirizine-pseudoephedrine (ZYRTEC-D) 5-120 MG tablet Take 1 tablet by mouth daily. 30 tablet Crecencio Mc P, PA-C   amoxicillin-clavulanate (AUGMENTIN) 875-125 MG tablet Take 1 tablet by mouth every 12 (twelve) hours. 14 tablet Lorin Picket, PA-C     Controlled Substance Prescriptions [place] Controlled Substance Registry consulted? Not Applicable   Lorin Picket, PA-C 07/27/18 1642

## 2021-09-07 ENCOUNTER — Ambulatory Visit (INDEPENDENT_AMBULATORY_CARE_PROVIDER_SITE_OTHER): Payer: Medicaid Other | Admitting: Family Medicine

## 2021-09-07 ENCOUNTER — Ambulatory Visit
Admission: RE | Admit: 2021-09-07 | Discharge: 2021-09-07 | Disposition: A | Discharge status: Home / Self Care | Payer: Medicaid Other | Source: Ambulatory Visit | Attending: Family Medicine | Admitting: Family Medicine

## 2021-09-07 ENCOUNTER — Ambulatory Visit
Admission: RE | Admit: 2021-09-07 | Discharge: 2021-09-07 | Disposition: A | Discharge status: Home / Self Care | Payer: Medicaid Other | Attending: Family Medicine | Admitting: Family Medicine

## 2021-09-07 ENCOUNTER — Other Ambulatory Visit: Payer: Self-pay

## 2021-09-07 ENCOUNTER — Encounter: Payer: Self-pay | Admitting: Family Medicine

## 2021-09-07 VITALS — BP 136/76 | HR 78 | Ht 65.0 in | Wt 247.6 lb

## 2021-09-07 DIAGNOSIS — M5416 Radiculopathy, lumbar region: Secondary | ICD-10-CM | POA: Insufficient documentation

## 2021-09-07 DIAGNOSIS — Z124 Encounter for screening for malignant neoplasm of cervix: Secondary | ICD-10-CM | POA: Insufficient documentation

## 2021-09-07 DIAGNOSIS — L989 Disorder of the skin and subcutaneous tissue, unspecified: Secondary | ICD-10-CM | POA: Diagnosis not present

## 2021-09-07 DIAGNOSIS — M6208 Separation of muscle (nontraumatic), other site: Secondary | ICD-10-CM

## 2021-09-07 MED ORDER — DICLOFENAC SODIUM 50 MG PO TBEC: 50.0000 mg | DELAYED_RELEASE_TABLET | Freq: Two times a day (BID) | ORAL | 0 refills | Status: DC

## 2021-09-07 NOTE — Patient Instructions (Signed)
-   Referral coordinator will contact in regards to scheduling visits with OB/GYN, dermatology, physical therapy ?-Obtain x-rays today ?- Start home exercises with information provided ?- Can dose diclofenac every 12 hours on as-needed basis (take with food) for pain preventing you from participating in physical therapy/home exercises ?- Return for follow-up in 6 weeks for annual physical ?- Contact us for any questions between now and then ?

## 2021-09-07 NOTE — Progress Notes (Signed)
?  ? ?  Primary Care / Sports Medicine Office Visit ? ?Patient Information:  ?Patient ID: Rebekah Parks, female DOB: 1991-03-30 Age: 31 y.o. MRN: [id number]  ? ?Rebekah Parks is a pleasant 31 y.o. female presenting with the following: ? ?Chief Complaint  ?Patient presents with  ? Establish Care  ?  Pt here with issues with weight, lost 20lbs since making appointment,  ?Blood pressure-pt has been to UC in [place] to have blood pressure checked monthly (137/78) mom has HX of HBP.   ? Foot Pain  ?  Bilateral, top, some times radiates.  ? Diastasis recti  ?  Since pregnant with 31 year old  ? Lip issue  ?  Pt has bump on bottom lip that has been there for years, thinks it has got bigger.   ? ? ?Vitals:  ? 09/07/21 1426  ?BP: 136/76  ?Pulse: 78  ?SpO2: 98%  ? ?Vitals:  ? 09/07/21 1426  ?Weight: 247 lb 9.6 oz (112.3 kg)  ?Height: '5\' 5"'$  (1.651 m)  ? ?Body mass index is 41.2 kg/m?. ? ?No results found.  ? ?Independent interpretation of notes and tests performed by another provider:  ? ?None ? ?Procedures performed:  ? ?None ? ?Pertinent History, Exam, Impression, and Recommendations:  ? ?Bilateral lumbar radiculopathy ?Chronic issue previously with successful treatment following physical therapy, has noted recent progressive bilateral lower extremity paresthesias, right greater than left, primarily towards the end of the day.  She has had comorbid intermittent low back tightness. ? ?Examination reveals negative straight leg raise bilaterally, equivocal FABER bilaterally, negative FADIR, negative piriformis testing, sensorimotor preserved in bilateral lower extremities. ? ?Given her stated symptomatology, review of prior lumbar spine imaging, her symptoms are most consistent with exacerbation of previously noted lumbosacral intervertebral disorder.  I have advised formal physical therapy, as needed diclofenac as an adjunct to this, dedicated updated lumbar spine x-rays, and appropriate follow-up. ? ?Chronic condition,  symptomatic, Rx management ? ?Diastasis recti ?Chronic issue since birth of 16-year-old, noted during gestation, no treatments to date.  Examination with inferior component of the rectus abdominal eye mild separation, treatments reviewed both surgical and nonsurgical, she is amenable to nonsurgical management.  Plan for physical therapy, referral placed. ? ?Cervical cancer screening ?Referral to OB/GYN for establishment of care and routine well woman screening. ? ?Skin lesions ?Patient with longstanding history of lower lip lesion without significant change, ongoing for years, additional findings at the right anteromedial thigh, right lateral ankle.  Etiologies could include epidermal inclusion cysts, lipomas, dermatofibroma.  There is do not demonstrate irregular borders, rapid change in size, significant symptomatology.  Referral to dermatology placed for further evaluation and management.  ? ?Orders & Medications ?Meds ordered this encounter  ?Medications  ? diclofenac (VOLTAREN) 50 MG EC tablet  ?  Sig: Take 1 tablet (50 mg total) by mouth 2 (two) times daily.  ?  Dispense:  60 tablet  ?  Refill:  0  ? ?Orders Placed This Encounter  ?Procedures  ? DG Lumbar Spine Complete  ? Ambulatory referral to Dermatology  ? Ambulatory referral to Gynecology  ?  ? ?Return in about 6 weeks (around 10/19/2021) for Annual physical.  ?  ? ?Montel Culver, MD ? ? Primary Care Sports Medicine ?[facility] ?[place]  ? ?

## 2021-09-07 NOTE — Assessment & Plan Note (Signed)
Chronic issue previously with successful treatment following physical therapy, has noted recent progressive bilateral lower extremity paresthesias, right greater than left, primarily towards the end of the day.  She has had comorbid intermittent low back tightness. ? ?Examination reveals negative straight leg raise bilaterally, equivocal FABER bilaterally, negative FADIR, negative piriformis testing, sensorimotor preserved in bilateral lower extremities. ? ?Given her stated symptomatology, review of prior lumbar spine imaging, her symptoms are most consistent with exacerbation of previously noted lumbosacral intervertebral disorder.  I have advised formal physical therapy, as needed diclofenac as an adjunct to this, dedicated updated lumbar spine x-rays, and appropriate follow-up. ? ?Chronic condition, symptomatic, Rx management ?

## 2021-09-07 NOTE — Assessment & Plan Note (Signed)
Patient with longstanding history of lower lip lesion without significant change, ongoing for years, additional findings at the right anteromedial thigh, right lateral ankle.  Etiologies could include epidermal inclusion cysts, lipomas, dermatofibroma.  There is do not demonstrate irregular borders, rapid change in size, significant symptomatology.  Referral to dermatology placed for further evaluation and management. ?

## 2021-09-07 NOTE — Assessment & Plan Note (Signed)
Chronic issue since birth of 31-year-old, noted during gestation, no treatments to date.  Examination with inferior component of the rectus abdominal eye mild separation, treatments reviewed both surgical and nonsurgical, she is amenable to nonsurgical management.  Plan for physical therapy, referral placed. ?

## 2021-09-07 NOTE — Assessment & Plan Note (Signed)
Referral to OB/GYN for establishment of care and routine well woman screening. ?

## 2021-09-09 ENCOUNTER — Encounter: Payer: Self-pay | Admitting: Family Medicine

## 2021-09-09 NOTE — Telephone Encounter (Signed)
FYI

## 2021-09-13 ENCOUNTER — Ambulatory Visit
Admission: RE | Admit: 2021-09-13 | Discharge: 2021-09-13 | Disposition: A | Discharge status: Home / Self Care | Payer: Medicaid Other | Attending: Family Medicine | Admitting: Family Medicine

## 2021-09-13 ENCOUNTER — Ambulatory Visit (INDEPENDENT_AMBULATORY_CARE_PROVIDER_SITE_OTHER): Payer: Medicaid Other

## 2021-09-13 ENCOUNTER — Ambulatory Visit
Admission: RE | Admit: 2021-09-13 | Discharge: 2021-09-13 | Disposition: A | Discharge status: Home / Self Care | Payer: Medicaid Other | Source: Ambulatory Visit | Attending: Family Medicine | Admitting: Family Medicine

## 2021-09-13 DIAGNOSIS — N912 Amenorrhea, unspecified: Secondary | ICD-10-CM

## 2021-09-13 DIAGNOSIS — M5416 Radiculopathy, lumbar region: Secondary | ICD-10-CM | POA: Diagnosis present

## 2021-09-13 LAB — POCT URINE PREGNANCY: Preg Test, Ur: NEGATIVE

## 2021-09-28 ENCOUNTER — Other Ambulatory Visit: Payer: Self-pay

## 2021-09-28 DIAGNOSIS — M6208 Separation of muscle (nontraumatic), other site: Secondary | ICD-10-CM

## 2021-09-28 DIAGNOSIS — M5416 Radiculopathy, lumbar region: Secondary | ICD-10-CM

## 2021-10-22 ENCOUNTER — Ambulatory Visit: Payer: Medicaid Other | Admitting: Physical Therapy

## 2021-10-26 ENCOUNTER — Encounter: Payer: Self-pay | Admitting: Licensed Practical Nurse

## 2021-10-28 ENCOUNTER — Ambulatory Visit: Payer: Medicaid Other | Admitting: Physical Therapy

## 2021-10-29 ENCOUNTER — Encounter: Payer: Self-pay | Admitting: Physical Therapy

## 2021-10-29 ENCOUNTER — Ambulatory Visit: Payer: Medicaid Other | Attending: Family Medicine | Admitting: Physical Therapy

## 2021-10-29 DIAGNOSIS — M5459 Other low back pain: Secondary | ICD-10-CM | POA: Insufficient documentation

## 2021-10-29 DIAGNOSIS — M6281 Muscle weakness (generalized): Secondary | ICD-10-CM | POA: Diagnosis present

## 2021-10-29 DIAGNOSIS — M5416 Radiculopathy, lumbar region: Secondary | ICD-10-CM | POA: Insufficient documentation

## 2021-10-29 DIAGNOSIS — R293 Abnormal posture: Secondary | ICD-10-CM | POA: Insufficient documentation

## 2021-10-29 DIAGNOSIS — M6208 Separation of muscle (nontraumatic), other site: Secondary | ICD-10-CM | POA: Diagnosis not present

## 2021-10-29 NOTE — Therapy (Signed)
OUTPATIENT PHYSICAL THERAPY BACK EVALUATION   Patient Name: Rebekah Parks MRN: [id number] DOB:[birth year], 31 y.o., female Today's Date: 10/29/2021   PT End of Session - 10/29/21 1123     Visit Number 1    Number of Visits 8    Date for PT Re-Evaluation 12/24/21    Authorization Type [employer] Medicaid    PT Start Time 1025    PT Stop Time 1914    PT Time Calculation (min) 40 min    Activity Tolerance Patient tolerated treatment well;Other (comment)   Limited by late check-in   Behavior During Therapy WFL for tasks assessed/performed             Past Medical History:  Diagnosis Date   Chronic back pain    HNP,spondylosis/DDD/radiculopathy   Past Surgical History:  Procedure Laterality Date   WISDOM TOOTH EXTRACTION     Patient Active Problem List   Diagnosis Date Noted   Bilateral lumbar radiculopathy 09/07/2021   Cervical cancer screening 09/07/2021   Diastasis recti 09/07/2021   Skin lesions 09/07/2021   GBS (group B Streptococcus carrier), +RV culture, currently pregnant 06/21/2016   Supervision of other high risk pregnancy, antepartum 04/16/2016   Blood type, Rh negative 78/29/5621   Drug use complicating pregnancy, unspecified trimester 12/28/2015   Family history of coarctation of aorta 12/28/2015   Maternal obesity, antepartum 12/28/2015   Obesity, Class II, BMI 35-39.9 11/21/2013   H/O opioid abuse ([place]) 10/22/2013    PCP: Montel Culver, MD  REFERRING PROVIDER: Montel Culver, MD  REFERRING DIAG: M54.16 (ICD-10-CM) - Bilateral lumbar radiculopathy M62.08 (ICD-10-CM) - Diastasis recti   THERAPY DIAG:  Other low back pain  Abnormal posture  Muscle weakness (generalized)  Rationale for Evaluation and Treatment Rehabilitation  PRECAUTIONS: None  WEIGHT BEARING RESTRICTIONS No  FALLS:  Has patient fallen in last 6 months? No  ONSET DATE: 2013; re-emergence 08/2021  SUBJECTIVE:                                                                                                                                                                                            CHIEF COMPLAINT: Patient notes that she has been able to resolve back pain independently with stretches. Notes that she still has back pain minimally and now has pain in shins and feet. 10 years ago had back pain with sciatic pain. Patient has made adjustments to posture to help alleviate pain and notes that for the most part it is gone. Patient has AM back pain on waking or in the evenings, but notes she is pretty tough with pain and pushes through. Patient  has pain in both feet and tightness in RLE, R>L.   Patient is also concerned for DRAM. Patient has tried exercises in the past but didn't have the best success because she wasn't sure if she was doing this correctly.   PERTINENT HISTORY/CHART REVIEW:  Red flags (bowel/bladder changes, saddle paresthesia, personal history of cancer, h/o spinal tumors, h/o compression fx, h/o abdominal aneurysm, abdominal pain, chills/fever, night sweats, nausea, vomiting, unrelenting pain, first onset of insidious LBP <20 y/o): Negative   PAIN:  Are you having pain? 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    GASTROINTESTINAL HISTORY: Patient denies any concerns.     OBJECTIVE:  DIAGNOSTIC  TESTING/IMAGING: FINDINGS: Frontal, bilateral oblique, lateral views of the lumbar spine are obtained. There are 5 non-rib-bearing lumbar type vertebral bodies identified, with bilateral sacralization at the L5 level again noted. There is mild left convex curvature centered at the thoracolumbar junction. Otherwise alignment is anatomic. No acute fracture. Mild spondylosis unchanged at T12/L1 and L1/L2. Asymmetric sclerosis of the sacroiliac joints, right greater than left, consistent with sacroiliitis. No erosive changes.   IMPRESSION: 1. 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 2. Progressive marked spondylosis and facet hypertrophy at L4-5 and L5-S1. 3. Interval development of asymmetric sclerosis of the sacroiliac joints, right greater than left, compatible with sacroiliitis. 4. Minimal left convex scoliosis.  COGNITION:  Patient is oriented to person, place, and time.  Recent memory is intact.  Remote memory is intact.  Attention span and concentration are intact.  Expressive speech is intact.  Patient's fund of knowledge is within normal limits for educational level.    POSTURE/OBSERVATIONS:   Lumbar lordosis: increased Thoracic kyphosis: mildly increased with rounded shoulders Iliac crest height: not formally assessed  Lumbar lateral shift: not formally assessed  Pelvic obliquity: not formally assessed  Leg length discrepancy: not formally assessed    GAIT: Grossly WFL. Trendelenburg R: Positive L: Positive   RANGE OF MOTION:   AROM (Normal range in degrees) AROM  10/29/2021  Lumbar   Flexion (65) WNL  Extension (30) WNL* B  Right lateral flexion (25) WNL  Left lateral flexion (25) WNL * (on return to standing)  Right rotation (30) WNL  Left rotation (30) WNL      Hip (PROM) LEFT RIGHT  Flexion (125) WNL WNL  Extension (15) WNL WNL  Abduction (40) WNL WNL  Adduction  WNL WNL  Internal Rotation (45) Limited, nonpainful  Limited, non-painful  External Rotation (45) WNL WNL  (* = pain; blank rows = not tested)  SENSATION:  Grossly intact to light touch bilateral LEs as determined by testing dermatomes L2-S2 Proprioception and hot/cold testing deferred on this date   STRENGTH: MMT    RLE LLE  Hip Flexion 5 5  Hip Extension    Hip Abduction  5 5  Hip Adduction  5 5  Hip ER  4 4  Hip  IR  5 5  Knee Extension 5 5  Knee Flexion 5 5  Dorsiflexion  5 5  (* = pain; blank rows = not tested)   MUSCLE LENGTH: deferred 2/2 to time constraints  Hamstrings: [place] (quadriceps): Thomas (hip flexors): Ober:    ABDOMINAL:   Palpation: no TTP Diastasis: roughly 2 finger breadth throughout linea alba Scar mobility: n/a Rib flare: none noted   SPECIAL TESTS:   Centralization and Peripheralization (SN 92, -LR 0.12): Negative Slump (SN 83, -LR 0.32): R: Negative L: Negative SLR (SN 92, -LR 0.29): R: Negative L:  Negative FABER (SN 81): R: Negative L: Negative FADIR (SN 94): R: Negative L: Negative    PALPATION: deferred 2/2 to time constraints  LOCATION LEFT  RIGHT           Lumbar paraspinals    Quadratus Lumborum    Gluteus Maximus    Gluteus Medius    Deep hip external rotators    PSIS    Fortin's Area (SIJ)    Greater Trochanter    ASIS    Sacral border    Coccyx    Ischial tuberosity    (blank rows = not tested) Graded on 0-4 scale (0 = no pain, 1 = pain, 2 = pain with wincing/grimacing/flinching, 3 = pain with withdrawal, 4 = unwilling to allow palpation)   PHYSICAL PERFORMANCE MEASURES:  STS: WNL Deep Squat: RLE STS: LLE STS:  6MWT: 5TSTS:     PATIENT EDUCATION:  Patient educated on prognosis, POC, and provided with HEP including: not initiated. Patient articulated understanding and returned demonstration. Patient will benefit from further education in order to maximize compliance and understanding for long-term therapeutic gains.   PATIENT SURVEYS:  FOTO  94  ASSESSMENT:  Clinical Impression: Patient is a 31 year old presenting to clinic with chief complaints of low back pain with radiating symptoms to the feet (currently well managed by independent stretching at home) and longstanding diastasis recti. Today's evaluation is suggestive of deficits in IAP management, deep core strength and coordination, and posture as evidenced by increased lumbar lordosis in standing with rounded shoulders and mildly increased thoracic kyphosis, 2 finger separation of rectus abdominis with apparent rectus dominance on testing, 3/10 worst back pain. Examination was reassuring for no significant/persisting lumbar or hip symptomology--all testing negative (see above). Patient's responses on FOTO outcome measures (94) indicate negligible functional limitations/disability/distress. Patient's progress may be limited due to scheduling needs; however, patient's motivation to independently manage conditions is advantageous. We discussed developing and practicing a foundational comprehensive HEP at next visit with follow-up 2-4 weeks later for improved ease of participation. Patient will benefit from continued skilled therapeutic intervention to address deficits in IAP management, deep core strength and coordination, and posture in order to increase function and improve overall QOL.   Objective impairments: decreased activity tolerance, decreased coordination, decreased endurance, decreased strength, improper body mechanics, postural dysfunction, and pain.   Activity limitations: community activity and occupation.   Personal factors: Behavior pattern, Past/current experiences, Time since onset of injury/illness/exacerbation, and 1-2 comorbidities: HNP,spondylosis/DDD/radiculopathy  are also affecting patient's functional outcome.   Rehab Potential: Good  Clinical decision making: Evolving/moderate complexity  Evaluation complexity: Moderate   GOALS: Goals reviewed with  patient? Yes  SHORT TERM GOALS: Target date: 11/26/2021  Patient will demonstrate independence with HEP in order to maximize therapeutic gains and improve carryover from physical therapy sessions to ADLs in the home and community. Baseline: not initiated Goal status: INITIAL  LONG TERM GOALS: Target date: 12/24/2021  Patient will demonstrate maintained function as evidenced by a score of 94 on FOTO measure for full participation in activities at home and in the community.  Baseline: 94 Goal status: INITIAL  Patient will decrease worst pain as reported on NPRS by at least 2 points to demonstrate clinically significant reduction in pain in order to restore/improve function and overall QOL. Baseline: 3/10 Goal status: INITIAL  Patient will demonstrate improved IAP management and deep core strength as evidenced by linea alba width < 2 fingers throughout with no apparent doming or coning of abdominals on testing for improved postural control and strength. Baseline: 2 fingers throughout Goal status: INITIAL   PLAN: Rehab frequency: 1x/week  Rehab duration: 8 weeks  Planned interventions: Therapeutic exercises, Therapeutic activity, Neuromuscular re-education, Balance training, Gait training, Patient/Family education, Joint mobilization, Orthotic/Fit training, Electrical stimulation, Spinal mobilization, Cryotherapy, Moist heat, Taping, and Manual therapy     Myles Gip PT, DPT [phone]  10/29/2021, 11:29 AM

## 2021-11-02 ENCOUNTER — Encounter: Payer: Self-pay | Admitting: Physical Therapy

## 2021-11-02 ENCOUNTER — Encounter: Payer: Self-pay | Admitting: Family Medicine

## 2021-11-02 ENCOUNTER — Ambulatory Visit: Payer: Medicaid Other | Admitting: Physical Therapy

## 2021-11-02 ENCOUNTER — Ambulatory Visit (INDEPENDENT_AMBULATORY_CARE_PROVIDER_SITE_OTHER): Payer: Medicaid Other | Admitting: Family Medicine

## 2021-11-02 VITALS — BP 138/86 | HR 78 | Ht 65.0 in | Wt 239.8 lb

## 2021-11-02 DIAGNOSIS — Z0000 Encounter for general adult medical examination without abnormal findings: Secondary | ICD-10-CM

## 2021-11-02 DIAGNOSIS — M5459 Other low back pain: Secondary | ICD-10-CM | POA: Diagnosis not present

## 2021-11-02 DIAGNOSIS — M6281 Muscle weakness (generalized): Secondary | ICD-10-CM

## 2021-11-02 DIAGNOSIS — R293 Abnormal posture: Secondary | ICD-10-CM

## 2021-11-02 DIAGNOSIS — R7989 Other specified abnormal findings of blood chemistry: Secondary | ICD-10-CM

## 2021-11-02 DIAGNOSIS — Z1159 Encounter for screening for other viral diseases: Secondary | ICD-10-CM

## 2021-11-02 DIAGNOSIS — M5416 Radiculopathy, lumbar region: Secondary | ICD-10-CM

## 2021-11-02 DIAGNOSIS — Z114 Encounter for screening for human immunodeficiency virus [HIV]: Secondary | ICD-10-CM

## 2021-11-02 DIAGNOSIS — Z13220 Encounter for screening for lipoid disorders: Secondary | ICD-10-CM | POA: Diagnosis not present

## 2021-11-02 NOTE — Progress Notes (Signed)
Annual Physical Exam Visit  Patient Information:  Patient ID: Rebekah Parks, female DOB: [birth year] Age: 31 y.o. MRN: [id number]   Subjective:   CC: Annual Physical Exam  HPI:  Rebekah Parks is here for their annual physical.  I reviewed the past medical history, family history, social history, surgical history, and allergies today and changes were made as necessary.  Please see the problem list section below for additional details.  Past Medical History: Past Medical History:  Diagnosis Date   Chronic back pain    HNP,spondylosis/DDD/radiculopathy   Past Surgical History: Past Surgical History:  Procedure Laterality Date   WISDOM TOOTH EXTRACTION     Family History: Family History  Problem Relation Age of Onset   Healthy Mother    Healthy Father    Allergies: No Known Allergies Health Maintenance: Health Maintenance  Topic Date Due   COVID-19 Vaccine (1) Never done   PAP SMEAR-Modifier  Never done   INFLUENZA VACCINE  01/04/2022   TETANUS/TDAP  03/11/2025   Hepatitis C Screening  Completed   HIV Screening  Completed   HPV VACCINES  Aged Out    HM Colonoscopy     This patient has no relevant Health Maintenance data.      Medications: Current Outpatient Medications on File Prior to Visit  Medication Sig Dispense Refill   diclofenac (VOLTAREN) 50 MG EC tablet Take 1 tablet (50 mg total) by mouth 2 (two) times daily. 60 tablet 0   SUBOXONE 8-2 MG FILM Place under the tongue 2 (two) times daily.     No current facility-administered medications on file prior to visit.    Review of Systems: No headache, visual changes, nausea, vomiting, diarrhea, constipation, dizziness, abdominal pain, skin rash, fevers, chills, night sweats, swollen lymph nodes, weight loss, chest pain, body aches, joint swelling, muscle aches, shortness of breath, mood changes, visual or auditory hallucinations reported.  Objective:   Vitals:   11/02/21 1405  BP: (!) 158/100   Pulse: 78  SpO2: 98%   Vitals:   11/02/21 1405  Weight: 239 lb 12.8 oz (108.8 kg)  Height: '5\' 5"'$  (1.651 m)   Body mass index is 39.9 kg/m.  General: Well Developed, well nourished, and in no acute distress.  Neuro: Alert and oriented x3, extra-ocular muscles intact, sensation grossly intact. Cranial nerves II through XII are grossly intact, motor, sensory, and coordinative functions are intact. HEENT: Normocephalic, atraumatic, pupils equal round reactive to light, neck supple, no masses, no lymphadenopathy, thyroid nonpalpable. Oropharynx, nasopharynx, external ear canals are unremarkable. Skin: Warm and dry, no rashes noted.  Cardiac: Regular rate and rhythm, no murmurs rubs or gallops. No peripheral edema. Pulses symmetric. Respiratory: Clear to auscultation bilaterally. Not using accessory muscles, speaking in full sentences.  Abdominal: Soft, nontender, nondistended, positive bowel sounds, no masses, no organomegaly. Musculoskeletal: Shoulder, elbow, wrist, hip, knee, ankle stable, and with full range of motion.  Female chaperone initials: Colona present throughout the physical examination.  Impression and Recommendations:   The patient was counselled, risk factors were discussed, and anticipatory guidance given.  Problem List Items Addressed This Visit       Nervous and Auditory   Bilateral lumbar radiculopathy    Had noted improvement and near resolution following home exercises, since reducing frequency of these exercises pain has returned.  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  I have advised restart of home exercises, she has just started formal physical therapy and  she is to continue this for full course, can follow-up as needed for this issue.         Other   Annual physical exam - Primary    Annual examination completed, risk stratification labs ordered, anticipatory guidance provided.  We will follow labs once  resulted.       Relevant Orders   Apo A1 + B + Ratio   CBC   Comprehensive metabolic panel   Hepatitis C antibody   VITAMIN D 25 Hydroxy (Vit-D Deficiency, Fractures)   TSH   Lipid panel   HIV Antibody (routine testing w rflx)   Other Visit Diagnoses     Screening for HIV (human immunodeficiency virus)       Relevant Orders   HIV Antibody (routine testing w rflx)   Need for hepatitis C screening test       Relevant Orders   Hepatitis C antibody   Screening for lipoid disorders       Relevant Orders   Apo A1 + B + Ratio   Lipid panel   Low serum vitamin D       Relevant Orders   VITAMIN D 25 Hydroxy (Vit-D Deficiency, Fractures)        Orders & Medications Medications: No orders of the defined types were placed in this encounter.  Orders Placed This Encounter  Procedures   Apo A1 + B + Ratio   CBC   Comprehensive metabolic panel   Hepatitis C antibody   VITAMIN D 25 Hydroxy (Vit-D Deficiency, Fractures)   TSH   Lipid panel   HIV Antibody (routine testing w rflx)     Return in about 1 year (around 11/03/2022).    Montel Culver, MD   [facility]

## 2021-11-02 NOTE — Assessment & Plan Note (Signed)
Annual examination completed, risk stratification labs ordered, anticipatory guidance provided.  We will follow labs once resulted. 

## 2021-11-02 NOTE — Assessment & Plan Note (Signed)
Had noted improvement and near resolution following home exercises, since reducing frequency of these exercises pain has returned.  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  I have advised restart of home exercises, she has just started formal physical therapy and she is to continue this for full course, can follow-up as needed for this issue.

## 2021-11-02 NOTE — Therapy (Signed)
OUTPATIENT PHYSICAL THERAPY BACK EVALUATION   Patient Name: Rebekah Parks MRN: [id number] DOB:Oct 31, 1990, 31 y.o., female Today's Date: 11/02/2021   PT End of Session - 11/02/21 1636     Visit Number 2    Number of Visits 8    Date for PT Re-Evaluation 12/24/21    Authorization Type [employer] Medicaid    PT Start Time 1630    PT Stop Time 1710    PT Time Calculation (min) 40 min    Activity Tolerance Patient tolerated treatment well    Behavior During Therapy [facility] for tasks assessed/performed             Past Medical History:  Diagnosis Date   Chronic back pain    HNP,spondylosis/DDD/radiculopathy   Past Surgical History:  Procedure Laterality Date   WISDOM TOOTH EXTRACTION     Patient Active Problem List   Diagnosis Date Noted   Annual physical exam 11/02/2021   Bilateral lumbar radiculopathy 09/07/2021   Cervical cancer screening 09/07/2021   Diastasis recti 09/07/2021   Skin lesions 09/07/2021   GBS (group B Streptococcus carrier), +RV culture, currently pregnant 06/21/2016   Supervision of other high risk pregnancy, antepartum 04/16/2016   Blood type, Rh negative 24/58/0998   Drug use complicating pregnancy, unspecified trimester 12/28/2015   Family history of coarctation of aorta 12/28/2015   Maternal obesity, antepartum 12/28/2015   Obesity, Class II, BMI 35-39.9 11/21/2013   H/O opioid abuse ([place]) 10/22/2013    PCP: Montel Culver, MD  REFERRING PROVIDER: Montel Culver, MD  REFERRING DIAG: M54.16 (ICD-10-CM) - Bilateral lumbar radiculopathy M62.08 (ICD-10-CM) - Diastasis recti   THERAPY DIAG:  Other low back pain  Abnormal posture  Muscle weakness (generalized)  Rationale for Evaluation and Treatment Rehabilitation  PRECAUTIONS: None  WEIGHT BEARING RESTRICTIONS No  FALLS:  Has patient fallen in last 6 months? No  ONSET DATE: 2013; re-emergence 08/2021  SUBJECTIVE: Patient notes that she has had no changes in symptoms since evaluation.  Patient is ready for exercise program.                                                                                                                                                                                       PERTINENT HISTORY/CHART REVIEW:  Red flags (bowel/bladder changes, saddle paresthesia, personal history of cancer, h/o spinal tumors, h/o compression fx, h/o abdominal aneurysm, abdominal pain, chills/fever, night sweats, nausea, vomiting, unrelenting pain, first onset of insidious LBP <20 y/o): Negative   PAIN:  Are you having pain? 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 There are 5 non-rib-bearing lumbar type vertebral bodies identified, with bilateral sacralization at the L5 level again noted. There is mild left convex curvature centered at the thoracolumbar junction. Otherwise alignment is anatomic. No acute fracture. Mild spondylosis unchanged at T12/L1 and L1/L2. Asymmetric sclerosis of the sacroiliac joints, right greater than left, consistent with sacroiliitis. No erosive changes.   IMPRESSION: 1. 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 2. Progressive marked spondylosis and facet hypertrophy at L4-5 and L5-S1. 3. Interval development of asymmetric sclerosis of the sacroiliac joints, right greater  than left, compatible with sacroiliitis. 4. Minimal left convex scoliosis.  TREATMENT  Manual Therapy:   Neuromuscular Re-education: [facility] for DRAM correction and postural endurance Supine diaphragmatic breathing Supine TrA with coordinated breath Supine bent knee fall out with TrA, coordinated breath Supine heel slides with TrA, coordinated breath Supine marches with TrA, coordinated breath Supine toe taps with TrA, coordinated breath Supine leg raise with TrA, coordinated breath  Therapeutic Exercise:   Treatments unbilled:  Post-treatment assessment:  Patient educated throughout session on appropriate technique and form using multi-modal cueing, HEP, and activity modification. Patient articulated understanding and returned demonstration.  Patient Response to interventions: Comfortable to return in 2 weeks  PATIENT SURVEYS:  FOTO 94  ASSESSMENT:  Clinical Impression: Patient presents to clinic with excellent motivation to participate in therapy. Patient demonstrates deficits in  IAP management, deep core strength and coordination, and posture. Patient able to achieve acceptable form for at least 5 repetitions of each phase of [facility] protocol during today's session and responded positively to active interventions. Patient will benefit from continued skilled therapeutic intervention to address remaining deficits in  IAP management, deep core strength and coordination, and posture in order to increase function and improve overall QOL.   Objective impairments: decreased activity tolerance, decreased coordination, decreased endurance, decreased strength, improper body mechanics, postural dysfunction, and pain.   Activity limitations: community activity and occupation.   Personal factors: Behavior pattern, Past/current experiences, Time since onset of injury/illness/exacerbation, and 1-2 comorbidities:  HNP,spondylosis/DDD/radiculopathy  are also affecting patient's functional outcome.   Rehab Potential: Good  Clinical decision making: Evolving/moderate complexity  Evaluation complexity: Moderate   GOALS: Goals reviewed with patient? Yes  SHORT TERM GOALS: Target date: 11/30/2021  Patient will demonstrate independence with HEP in order to maximize therapeutic gains and improve carryover from physical therapy sessions to ADLs in the home and community. Baseline: not initiated Goal status: INITIAL    LONG TERM GOALS: Target date: 12/28/2021  Patient will demonstrate maintained function as evidenced by a score of 94 on FOTO measure for full participation in activities at home and in the community.  Baseline: 94 Goal status: INITIAL  Patient will decrease worst pain as reported on NPRS by at least 2 points to demonstrate clinically significant reduction in pain in order to restore/improve function and overall QOL. Baseline: 3/10 Goal status: INITIAL  Patient will demonstrate improved IAP management and deep core strength as evidenced by linea alba width <  2 fingers throughout with no apparent doming or coning of abdominals on testing for improved postural control and strength. Baseline: 2 fingers throughout Goal status: INITIAL   PLAN: Rehab frequency: 1x/week  Rehab duration: 8 weeks  Planned interventions: Therapeutic exercises, Therapeutic activity, Neuromuscular re-education, Balance training, Gait training, Patient/Family education, Joint mobilization, Orthotic/Fit training, Electrical stimulation, Spinal mobilization, Cryotherapy, Moist heat, Taping, and Manual therapy     Myles Gip PT, DPT [phone]  11/02/2021, 4:37 PM

## 2021-11-02 NOTE — Patient Instructions (Signed)
-   Obtain fasting labs with orders provided (can have water or black coffee but otherwise no food or drink x 8 hours before labs) °- Review information provided °- Attend eye doctor annually, dentist every 6 months, work towards or maintain 30 minutes of moderate intensity physical activity at least 5 days per week, and consume a balanced diet °- Return in 1 year for physical °- Contact us for any questions between now and then °

## 2021-11-03 LAB — HIV ANTIBODY (ROUTINE TESTING W REFLEX): HIV Screen 4th Generation wRfx: NONREACTIVE

## 2021-11-03 LAB — TSH: TSH: 2.91 u[IU]/mL (ref 0.450–4.500)

## 2021-11-03 LAB — APO A1 + B + RATIO
Apolipo. B/A-1 Ratio: 0.6 ratio (ref 0.0–0.6)
Apolipoprotein A-1: 152 mg/dL (ref 116–209)
Apolipoprotein B: 97 mg/dL — ABNORMAL HIGH (ref <90)

## 2021-11-03 LAB — CBC
Hematocrit: 40.9 % (ref 34.0–46.6)
Hemoglobin: 13.9 g/dL (ref 11.1–15.9)
MCH: 28.5 pg (ref 26.6–33.0)
MCHC: 34 g/dL (ref 31.5–35.7)
MCV: 84 fL (ref 79–97)
Platelets: 282 10*3/uL (ref 150–450)
RBC: 4.88 x10E6/uL (ref 3.77–5.28)
RDW: 12.5 % (ref 11.7–15.4)
WBC: 5 10*3/uL (ref 3.4–10.8)

## 2021-11-03 LAB — COMPREHENSIVE METABOLIC PANEL
ALT: 10 IU/L (ref 0–32)
AST: 14 IU/L (ref 0–40)
Albumin/Globulin Ratio: 1.9 (ref 1.2–2.2)
Albumin: 4.4 g/dL (ref 3.8–4.8)
Alkaline Phosphatase: 63 IU/L (ref 44–121)
BUN/Creatinine Ratio: 13 (ref 9–23)
BUN: 9 mg/dL (ref 6–20)
Bilirubin Total: 0.6 mg/dL (ref 0.0–1.2)
CO2: 25 mmol/L (ref 20–29)
Calcium: 9.2 mg/dL (ref 8.7–10.2)
Chloride: 101 mmol/L (ref 96–106)
Creatinine, Ser: 0.7 mg/dL (ref 0.57–1.00)
Globulin, Total: 2.3 g/dL (ref 1.5–4.5)
Glucose: 87 mg/dL (ref 70–99)
Potassium: 4.8 mmol/L (ref 3.5–5.2)
Sodium: 138 mmol/L (ref 134–144)
Total Protein: 6.7 g/dL (ref 6.0–8.5)
eGFR: 119 mL/min/{1.73_m2} (ref >59)

## 2021-11-03 LAB — LIPID PANEL
Chol/HDL Ratio: 3.6 ratio (ref 0.0–4.4)
Cholesterol, Total: 182 mg/dL (ref 100–199)
HDL: 50 mg/dL (ref >39)
LDL Chol Calc (NIH): 111 mg/dL — ABNORMAL HIGH (ref 0–99)
Triglycerides: 115 mg/dL (ref 0–149)
VLDL Cholesterol Cal: 21 mg/dL (ref 5–40)

## 2021-11-03 LAB — HEPATITIS C ANTIBODY: Hep C Virus Ab: NONREACTIVE

## 2021-11-03 LAB — VITAMIN D 25 HYDROXY (VIT D DEFICIENCY, FRACTURES): Vit D, 25-Hydroxy: 27.6 ng/mL — ABNORMAL LOW (ref 30.0–100.0)

## 2021-11-04 ENCOUNTER — Encounter: Payer: Medicaid Other | Admitting: Physical Therapy

## 2021-11-08 ENCOUNTER — Other Ambulatory Visit: Payer: Self-pay | Admitting: Family Medicine

## 2021-11-08 DIAGNOSIS — R7989 Other specified abnormal findings of blood chemistry: Secondary | ICD-10-CM

## 2021-11-08 MED ORDER — VITAMIN D (ERGOCALCIFEROL) 1.25 MG (50000 UNIT) PO CAPS: 50000.0000 [IU] | ORAL_CAPSULE | ORAL | 0 refills | Status: DC

## 2021-11-09 ENCOUNTER — Encounter: Payer: Medicaid Other | Admitting: Physical Therapy

## 2021-11-11 ENCOUNTER — Encounter: Payer: Medicaid Other | Admitting: Physical Therapy

## 2021-11-13 ENCOUNTER — Encounter: Payer: Self-pay | Admitting: Family Medicine

## 2021-11-15 ENCOUNTER — Other Ambulatory Visit: Payer: Self-pay

## 2021-11-15 DIAGNOSIS — E785 Hyperlipidemia, unspecified: Secondary | ICD-10-CM

## 2021-11-15 DIAGNOSIS — Z13220 Encounter for screening for lipoid disorders: Secondary | ICD-10-CM

## 2021-11-15 NOTE — Telephone Encounter (Signed)
Please review.  KP

## 2021-11-16 ENCOUNTER — Encounter: Payer: Medicaid Other | Admitting: Physical Therapy

## 2021-11-18 ENCOUNTER — Ambulatory Visit: Payer: Medicaid Other | Attending: Family Medicine | Admitting: Physical Therapy

## 2021-11-18 ENCOUNTER — Encounter: Payer: Medicaid Other | Admitting: Physical Therapy

## 2021-11-18 ENCOUNTER — Encounter: Payer: Self-pay | Admitting: Physical Therapy

## 2021-11-18 DIAGNOSIS — M6281 Muscle weakness (generalized): Secondary | ICD-10-CM | POA: Diagnosis present

## 2021-11-18 DIAGNOSIS — M5459 Other low back pain: Secondary | ICD-10-CM | POA: Insufficient documentation

## 2021-11-18 DIAGNOSIS — R293 Abnormal posture: Secondary | ICD-10-CM | POA: Insufficient documentation

## 2021-11-18 NOTE — Therapy (Signed)
OUTPATIENT PHYSICAL THERAPY BACK TREATMENT   Patient Name: Rebekah Parks MRN: [id number] DOB:02-07-1991, 31 y.o., female Today's Date: 11/18/2021   PT End of Session - 11/18/21 1038     Visit Number 3    Number of Visits 8    Date for PT Re-Evaluation 12/24/21    Authorization Type [employer] Medicaid    PT Start Time 1035    PT Stop Time 1115    PT Time Calculation (min) 40 min    Activity Tolerance Patient tolerated treatment well    Behavior During Therapy [facility] for tasks assessed/performed             Past Medical History:  Diagnosis Date   Chronic back pain    HNP,spondylosis/DDD/radiculopathy   Past Surgical History:  Procedure Laterality Date   WISDOM TOOTH EXTRACTION     Patient Active Problem List   Diagnosis Date Noted   Annual physical exam 11/02/2021   Bilateral lumbar radiculopathy 09/07/2021   Cervical cancer screening 09/07/2021   Diastasis recti 09/07/2021   Skin lesions 09/07/2021   GBS (group B Streptococcus carrier), +RV culture, currently pregnant 06/21/2016   Supervision of other high risk pregnancy, antepartum 04/16/2016   Blood type, Rh negative 62/69/4854   Drug use complicating pregnancy, unspecified trimester 12/28/2015   Family history of coarctation of aorta 12/28/2015   Maternal obesity, antepartum 12/28/2015   Obesity, Class II, BMI 35-39.9 11/21/2013   H/O opioid abuse ([place]) 10/22/2013    PCP: Montel Culver, MD  REFERRING PROVIDER: Montel Culver, MD  REFERRING DIAG: M54.16 (ICD-10-CM) - Bilateral lumbar radiculopathy M62.08 (ICD-10-CM) - Diastasis recti   THERAPY DIAG:  Other low back pain  Abnormal posture  Muscle weakness (generalized)  Rationale for Evaluation and Treatment Rehabilitation  PRECAUTIONS: None  WEIGHT BEARING RESTRICTIONS No  FALLS:  Has patient fallen in last 6 months? No  ONSET DATE: 2013; re-emergence 08/2021  SUBJECTIVE: Patient reports that she has been working more on her feet. Patient also  has L hip pain with stretching. Patient did have increased pain on Tuesday which may have been attributable to bending forward more with work tasks. Patient notes pain today is well controlled and she has continued to do stretches with good effect.                                 PERTINENT HISTORY/CHART REVIEW:  Red flags (bowel/bladder changes, saddle paresthesia, personal history of cancer, h/o spinal tumors, h/o compression fx, h/o abdominal aneurysm, abdominal pain, chills/fever, night sweats, nausea, vomiting, unrelenting pain, first onset of insidious LBP <19 y/o): Negative   PAIN:  Are you having pain? 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 There are 5 non-rib-bearing lumbar type vertebral bodies identified, with bilateral sacralization at the L5 level again noted. There is mild left convex curvature centered at the thoracolumbar junction. Otherwise alignment is anatomic.  No acute fracture. Mild spondylosis unchanged at T12/L1 and L1/L2. Asymmetric sclerosis of the sacroiliac joints, right greater than left, consistent with sacroiliitis. No erosive changes.   IMPRESSION: 1. 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 2. Progressive marked spondylosis and facet hypertrophy at L4-5 and L5-S1. 3. Interval development of  asymmetric sclerosis of the sacroiliac joints, right greater than left, compatible with sacroiliitis. 4. Minimal left convex scoliosis.  TREATMENT  Manual Therapy:   Neuromuscular Re-education: Standing Pilates Postural Control Hug a Tree, GTB   Serve a Tray, GTB Chest expansion, GTB Pallof Press, B, for improved core stabilization and postural control, GTB Seated figure 4 stretch with hip hinge for improved pain modulation of L hip  Therapeutic Exercise:   Treatments unbilled:  Post-treatment assessment:  Patient educated throughout session on appropriate technique and form using multi-modal cueing, HEP, and activity modification. Patient articulated understanding and returned demonstration.  Patient Response to interventions: Comfortable to return in 2 weeks for reassesment; L hip assessment  PATIENT SURVEYS:  FOTO 94  ASSESSMENT:  Clinical Impression: Patient presents to clinic with excellent motivation to participate in therapy. Patient demonstrates deficits in  IAP management, deep core strength and coordination, and posture. Patient able to perform standing postural control/core stabilization interventions absent of pain during today's session and responded positively to active interventions. Patient will benefit from continued skilled therapeutic intervention to address remaining deficits in  IAP management, deep core strength and coordination, and posture in order to increase function and improve overall QOL.   Objective impairments: decreased activity tolerance, decreased coordination, decreased endurance, decreased strength, improper body mechanics, postural dysfunction, and pain.   Activity limitations: community activity and occupation.   Personal factors: Behavior pattern, Past/current experiences, Time since onset of injury/illness/exacerbation, and 1-2 comorbidities: HNP,spondylosis/DDD/radiculopathy  are also affecting patient's functional outcome.   Rehab  Potential: Good  Clinical decision making: Evolving/moderate complexity  Evaluation complexity: Moderate   GOALS: Goals reviewed with patient? Yes  SHORT TERM GOALS: Target date: 11/26/2021  Patient will demonstrate independence with HEP in order to maximize therapeutic gains and improve carryover from physical therapy sessions to ADLs in the home and community. Baseline: not initiated Goal status: INITIAL    LONG TERM GOALS: Target date: 12/24/2021  Patient will demonstrate maintained function as evidenced by a score of 94 on FOTO measure for full participation in activities at home and in the community.  Baseline: 94 Goal status: INITIAL  Patient will decrease worst pain as reported on NPRS by at least 2 points to demonstrate clinically significant reduction in pain in order to restore/improve function and overall QOL. Baseline: 3/10 Goal status: INITIAL  Patient will demonstrate improved IAP management and deep core strength as evidenced by linea alba width < 2 fingers throughout with no apparent doming or coning of abdominals on testing for improved postural control and strength. Baseline: 2 fingers throughout Goal status: INITIAL   PLAN: Rehab frequency: 1x/week  Rehab duration: 8 weeks  Planned interventions: Therapeutic exercises, Therapeutic activity, Neuromuscular re-education, Balance training, Gait training, Patient/Family education, Joint mobilization, Orthotic/Fit training, Electrical stimulation, Spinal mobilization, Cryotherapy, Moist heat, Taping, and Manual therapy     Myles Gip PT, DPT [phone]  11/18/2021, 10:38 AM

## 2021-11-23 ENCOUNTER — Encounter: Payer: Medicaid Other | Admitting: Physical Therapy

## 2021-11-25 ENCOUNTER — Encounter: Payer: Self-pay | Admitting: Licensed Practical Nurse

## 2021-11-25 ENCOUNTER — Encounter: Payer: Medicaid Other | Admitting: Physical Therapy

## 2021-11-29 ENCOUNTER — Encounter: Payer: Self-pay | Admitting: Family Medicine

## 2021-12-02 ENCOUNTER — Encounter: Payer: Self-pay | Admitting: Physical Therapy

## 2021-12-06 ENCOUNTER — Encounter: Payer: Self-pay | Admitting: Physical Therapy

## 2021-12-06 ENCOUNTER — Ambulatory Visit: Payer: Medicaid Other | Attending: Family Medicine | Admitting: Physical Therapy

## 2021-12-06 ENCOUNTER — Other Ambulatory Visit: Payer: Self-pay | Admitting: Family Medicine

## 2021-12-06 DIAGNOSIS — M6281 Muscle weakness (generalized): Secondary | ICD-10-CM

## 2021-12-06 DIAGNOSIS — R293 Abnormal posture: Secondary | ICD-10-CM

## 2021-12-06 DIAGNOSIS — M5459 Other low back pain: Secondary | ICD-10-CM

## 2021-12-06 DIAGNOSIS — R7989 Other specified abnormal findings of blood chemistry: Secondary | ICD-10-CM

## 2021-12-06 NOTE — Therapy (Signed)
OUTPATIENT PHYSICAL THERAPY BACK TREATMENT   Patient Name: Rebekah Parks MRN: [id number] DOB:Oct 25, 1990, 31 y.o., female Today's Date: 12/06/2021   PT End of Session - 12/06/21 1520     Visit Number 4    Number of Visits 8    Date for PT Re-Evaluation 12/24/21    Authorization Type [employer] Medicaid    PT Start Time 0630    PT Stop Time 1555    PT Time Calculation (min) 40 min    Activity Tolerance Patient tolerated treatment well    Behavior During Therapy [facility] for tasks assessed/performed             Past Medical History:  Diagnosis Date   Chronic back pain    HNP,spondylosis/DDD/radiculopathy   Past Surgical History:  Procedure Laterality Date   WISDOM TOOTH EXTRACTION     Patient Active Problem List   Diagnosis Date Noted   Annual physical exam 11/02/2021   Bilateral lumbar radiculopathy 09/07/2021   Cervical cancer screening 09/07/2021   Diastasis recti 09/07/2021   Skin lesions 09/07/2021   GBS (group B Streptococcus carrier), +RV culture, currently pregnant 06/21/2016   Supervision of other high risk pregnancy, antepartum 04/16/2016   Blood type, Rh negative 16/06/930   Drug use complicating pregnancy, unspecified trimester 12/28/2015   Family history of coarctation of aorta 12/28/2015   Maternal obesity, antepartum 12/28/2015   Obesity, Class II, BMI 35-39.9 11/21/2013   H/O opioid abuse ([place]) 10/22/2013    PCP: Montel Culver, MD  REFERRING PROVIDER: Montel Culver, MD  REFERRING DIAG: M54.16 (ICD-10-CM) - Bilateral lumbar radiculopathy M62.08 (ICD-10-CM) - Diastasis recti   THERAPY DIAG:  Other low back pain  Abnormal posture  Muscle weakness (generalized)  Rationale for Evaluation and Treatment Rehabilitation  PRECAUTIONS: None  WEIGHT BEARING RESTRICTIONS No  FALLS:  Has patient fallen in last 6 months? No  ONSET DATE: 2013; re-emergence 08/2021  SUBJECTIVE: Patient states that she has had increased stress with son's recent UE  fracture. Patient has been somewhat consistent with HEP but in light of changes with work schedule and son's needs, she has had to make some modifications/adjustments. Patient notes that she has to do HEP at night to ensure back pain is controlled. Patient has been using some OTC pain medications to help manage on work days. Patient notes that she has had an increase in work hours/week from 58 - 30h. Patient notes that while back discomfort is B, she feels it more L than R which is different from her typical pain pattern.    PERTINENT HISTORY/CHART REVIEW:  Red flags (bowel/bladder changes, saddle paresthesia, personal history of cancer, h/o spinal tumors, h/o compression fx, h/o abdominal aneurysm, abdominal pain, chills/fever, night sweats, nausea, vomiting, unrelenting pain, first onset of insidious LBP <20 y/o): Negative   PAIN:  Are you having pain? 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 There are 5 non-rib-bearing lumbar type vertebral bodies identified, with bilateral  sacralization at the L5 level again noted. There is mild left convex curvature centered at the thoracolumbar junction. Otherwise alignment is anatomic. No acute fracture. Mild spondylosis unchanged at T12/L1 and L1/L2. Asymmetric sclerosis of the sacroiliac joints, right greater than  left, consistent with sacroiliitis. No erosive changes.   IMPRESSION: 1. 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 2. Progressive marked spondylosis and facet hypertrophy at L4-5 and L5-S1. 3. Interval development of asymmetric sclerosis of the sacroiliac joints, right greater than left, compatible with sacroiliitis. 4. Minimal left convex scoliosis.  TREATMENT  Manual Therapy: LAD of L hip with gentle oscillations for improved joint nutrition in the L hip as well as lumbar spine; pain modulation  Neuromuscular Re-education: Reassessed goals: see below.   Therapeutic Exercise:   Treatments unbilled:  Post-treatment assessment:  Patient educated throughout session on appropriate technique and form using multi-modal cueing, HEP, and activity modification. Patient articulated understanding and returned demonstration.  Patient Response to interventions: Comfortable to return in 6 weeks  PATIENT SURVEYS:  FOTO 94  ASSESSMENT:  Clinical Impression: Patient presents to clinic with excellent motivation to participate in therapy. Patient demonstrates deficits in  IAP management, deep core strength and coordination, and posture. Patient has reduced DRAM to roughly 1-1.5 fingers throughout linea alba on testing during today's session and responded positively to manual interventions performed at L hip for both hip and lumbar region. Patient comfortable to continue with HEP and follow up in 6 weeks to reassess progress/readiness for discharge. Patient will benefit from continued skilled therapeutic intervention to address remaining deficits in  IAP management, deep core strength and coordination, and posture in order to increase function and improve overall QOL.   Objective impairments: decreased activity tolerance, decreased coordination, decreased endurance, decreased strength, improper body mechanics, postural dysfunction, and pain.   Activity  limitations: community activity and occupation.   Personal factors: Behavior pattern, Past/current experiences, Time since onset of injury/illness/exacerbation, and 1-2 comorbidities: HNP,spondylosis/DDD/radiculopathy  are also affecting patient's functional outcome.   Rehab Potential: Good  Clinical decision making: Evolving/moderate complexity  Evaluation complexity: Moderate   GOALS: Goals reviewed with patient? Yes  SHORT TERM GOALS: Target date: 11/26/2021  Patient will demonstrate independence with HEP in order to maximize therapeutic gains and improve carryover from physical therapy sessions to ADLs in the home and community. Baseline: not initiated; 7/3: IND Goal status: MET    LONG TERM GOALS: Target date: 12/24/2021  Patient will demonstrate maintained function as evidenced by a score of 94 on FOTO measure for full participation in activities at home and in the community.  Baseline: 94; 7/3: 83 Goal status: IN PROGRESS  Patient will decrease worst pain as reported on NPRS by at least 2 points to demonstrate clinically significant reduction in pain in order to restore/improve function and overall QOL. Baseline: 3/10; 7/3: 3-4/10 Goal status: IN PROGRESS  Patient will demonstrate improved IAP management and deep core strength as evidenced by linea alba width < 2 fingers throughout with no apparent doming or coning of abdominals on testing for improved postural control and strength. Baseline: 2 fingers throughout; 7/3: ~ 1-1.5 fingers throughout Goal status: MET   PLAN: Rehab frequency: 1x/week  Rehab duration: 8 weeks  Planned interventions: Therapeutic exercises, Therapeutic activity, Neuromuscular re-education, Balance training, Gait training, Patient/Family education, Joint mobilization, Orthotic/Fit training, Electrical stimulation, Spinal mobilization, Cryotherapy, Moist heat, Taping, and Manual therapy     Myles Gip PT, DPT [phone]  12/06/2021, 3:21 PM

## 2021-12-08 NOTE — Telephone Encounter (Signed)
Requested medication (s) are due for refill today: yes  Requested medication (s) are on the active medication list: yes  Last refill:  11/08/21 #8 0 refills  Future visit scheduled: no  Notes to clinic:  manuel review per protocol . Do you want to refill rx?     Requested Prescriptions  Pending Prescriptions Disp Refills   Vitamin D, Ergocalciferol, (DRISDOL) 1.25 MG (50000 UNIT) CAPS capsule [Pharmacy Med Name: VITAMIN D (ERGOCALCIFEROL) 1.25 MG] 8 capsule 0    Sig: TAKE 1 CAPSULE BY MOUTH EVERY 7 DAYS     Endocrinology:  Vitamins - Vitamin D Supplementation 2 Failed - 12/06/2021 11:33 AM      Failed - Manual Review: Route requests for 50,000 IU strength to the provider      Failed - Vitamin D in normal range and within 360 days    Vit D, 25-Hydroxy  Date Value Ref Range Status  11/02/2021 27.6 (L) 30.0 - 100.0 ng/mL Final    Comment:    Vitamin D deficiency has been defined by the Institute of Medicine and an Endocrine Society practice guideline as a level of serum 25-OH vitamin D less than 20 ng/mL (1,2). The Endocrine Society went on to further define vitamin D insufficiency as a level between 21 and 29 ng/mL (2). 1. IOM (Institute of Medicine). 2010. Dietary reference    intakes for calcium and D. [place]: The    [employer]. 2. Holick MF, Binkley Elnora, Bischoff-Ferrari HA, et al.    Evaluation, treatment, and prevention of vitamin D    deficiency: an Endocrine Society clinical practice    guideline. JCEM. 2011 Jul; 96(7):1911-30.          Passed - Ca in normal range and within 360 days    Calcium  Date Value Ref Range Status  11/02/2021 9.2 8.7 - 10.2 mg/dL Final         Passed - Valid encounter within last 12 months    Recent Outpatient Visits           1 month ago Annual physical exam   [facility] Montel Culver, MD   3 months ago Bilateral lumbar radiculopathy   [facility] Montel Culver, MD       Future  Appointments             In 4 months Ralene Bathe, MD [place]

## 2021-12-20 ENCOUNTER — Encounter: Payer: Self-pay | Admitting: Family Medicine

## 2021-12-30 ENCOUNTER — Other Ambulatory Visit: Payer: Self-pay | Admitting: Family Medicine

## 2021-12-30 DIAGNOSIS — R7989 Other specified abnormal findings of blood chemistry: Secondary | ICD-10-CM

## 2022-01-03 ENCOUNTER — Encounter: Payer: Self-pay | Admitting: Licensed Practical Nurse

## 2022-01-10 ENCOUNTER — Ambulatory Visit: Payer: Medicaid Other | Admitting: Physical Therapy

## 2022-01-17 ENCOUNTER — Encounter: Payer: Medicaid Other | Admitting: Physical Therapy

## 2022-01-18 ENCOUNTER — Other Ambulatory Visit: Payer: Self-pay | Admitting: Family Medicine

## 2022-01-18 ENCOUNTER — Encounter: Payer: Medicaid Other | Admitting: Physical Therapy

## 2022-01-18 DIAGNOSIS — M5416 Radiculopathy, lumbar region: Secondary | ICD-10-CM

## 2022-01-19 MED ORDER — DICLOFENAC SODIUM 50 MG PO TBEC: 50.0000 mg | DELAYED_RELEASE_TABLET | Freq: Two times a day (BID) | ORAL | 0 refills | Status: DC

## 2022-01-20 ENCOUNTER — Ambulatory Visit: Payer: Medicaid Other | Admitting: Physical Therapy

## 2022-01-20 ENCOUNTER — Telehealth: Payer: Self-pay

## 2022-01-20 NOTE — Telephone Encounter (Signed)
PA completed waiting on insurance approval.  Key: [id number]  KP

## 2022-01-21 NOTE — Telephone Encounter (Signed)
Denied  KP 

## 2022-01-25 ENCOUNTER — Ambulatory Visit: Payer: Medicaid Other | Attending: Family Medicine | Admitting: Physical Therapy

## 2022-01-27 ENCOUNTER — Encounter: Payer: Medicaid Other | Attending: Family Medicine | Admitting: Dietician

## 2022-01-27 ENCOUNTER — Encounter: Payer: Self-pay | Admitting: Dietician

## 2022-01-27 VITALS — Ht 65.0 in | Wt 233.1 lb

## 2022-01-27 DIAGNOSIS — Z713 Dietary counseling and surveillance: Secondary | ICD-10-CM | POA: Diagnosis not present

## 2022-01-27 DIAGNOSIS — E785 Hyperlipidemia, unspecified: Secondary | ICD-10-CM | POA: Insufficient documentation

## 2022-01-27 DIAGNOSIS — E669 Obesity, unspecified: Secondary | ICD-10-CM

## 2022-01-27 NOTE — Patient Instructions (Addendum)
Gradually increase the number of home cooked meals. Plan some weekly meals prior to grocery shopping trip to ensure needed items are on hand.  Plan to include a veg and/or a fruit with each meal. Eat generous portions to aim for the goal of 5 or more servings daily. It is ok to work on this goal gradually also. Great job incorporating some whole grains and lean protein foods! Keep it up!

## 2022-01-27 NOTE — Progress Notes (Signed)
Medical Nutrition Therapy: Visit start time: 1000  end time: 1100  Assessment:   Referral Diagnosis: hyperlipidemia Other medical history/ diagnoses: elevated BP, lumbar radiculopathy Psychosocial issues/ stress concerns: none  Medications, supplements: reconciled list in medical record    Current weight: 233.1lbs Height: 5'5" BMI: 38.79   Progress and evaluation:  Patient reports some recent elevated blood pressure  Experienced some tingling in feet which she was worried might be related to BP, but is likely related to lower back issue.   She reports following heathy eating pattern during childhood and adolescence, has drifted away from healthy habits due to husband's less healthy habits. She reports her 3 children are also somewhat picky eaters. Work and after-school activities make home cooked meals a challenge at times.  Labs done 11/02/21 indicate total cholesterol 182, HDL 50, LDL 111, triglycerides 115 Food allergies: none Special diet practices: no Patient seeks help with improving health risk, healthy meal options    Dietary Intake:  Usual eating pattern includes 2 meals and 1-2 snacks per day. Dining out frequency: 1-2 meals per week. Who plans meals/ buys groceries? self Who prepares meals? self  Breakfast: overnight oats 3/4c oats, 1c oat milk low sugar vanilla, 1 Tbsp flax seeds, 1 Tbsp almond butter, 1-2Tbsp maple syrup; bagel for the past week; occ eggs Snack: none Lunch: snack around 3pm Belvita cookie (recalled due to peanut contamination); [place] granola bar; occ potato chips salt and vinegar Snack: none Supper: 7:30-8pm often tacos; spaghetti; eggs with potatoes and sausage Snack: occasionally chobani flip yogurt Beverages: water, occ diet soda <10oz; sugar free drinks  Physical activity: walks dog daily; active job working at [employer] center (started 08/2021), planning to start program 45 minutes 2x a week.   Intervention:   Nutrition Care  Education:   Basic nutrition: basic food groups; appropriate nutrient balance; appropriate meal and snack schedule; general nutrition guidelines; balanced meal options Weight control: importance of low sugar and low fat choices; portion control  Advanced nutrition: cooking techniques -- meal planning and prep Hypertension: identifying high sodium foods, goal for sodium intake; identifying food sources of potassium, magnesium Hyperlipidemia:  target goals for lipids; healthy and unhealthy fats; role of fiber, plant sterols; role of exercise   Other intervention notes: Patient voices readiness for change; she is making some healthy changes and choices already. Established goals for change with direction from patient.  She has concrete plans for increasing physical activity Patient declined scheduling follow up at this time; will schedule later if needed   Nutritional Diagnosis:  [place]-2.2 Altered nutrition-related laboratory As related to hyperlipidemia.  As evidenced by elevated LDL. [place]-3.3 Overweight/obesity As related to excess calories, inadequate physical activity.  As evidenced by patient with current BMI of 38.79.   Education Materials given:  [employer] guidelines for Cholesterol-lowering/ Heart health Museum/gallery conservator with food lists Foods to Choose to Lower Cholesterol (AHA) Sample menus Visit summary with goals/ instructions   Learner/ who was taught:  Patient   Level of understanding: Verbalizes/ demonstrates competency   Demonstrated degree of understanding via:   Teach back Learning barriers: None   Willingness to learn/ readiness for change: Eager, change in progress   Monitoring and Evaluation:  Dietary intake, exercise, blood lipids, and body weight      follow up: prn

## 2022-02-16 ENCOUNTER — Encounter: Payer: Self-pay | Admitting: Licensed Practical Nurse

## 2022-04-06 ENCOUNTER — Ambulatory Visit: Payer: Medicaid Other | Admitting: Dermatology

## 2022-04-07 ENCOUNTER — Ambulatory Visit: Payer: Medicaid Other | Admitting: Dermatology

## 2022-04-13 ENCOUNTER — Ambulatory Visit (INDEPENDENT_AMBULATORY_CARE_PROVIDER_SITE_OTHER): Payer: Medicaid Other | Admitting: Dermatology

## 2022-04-13 DIAGNOSIS — D1723 Benign lipomatous neoplasm of skin and subcutaneous tissue of right leg: Secondary | ICD-10-CM

## 2022-04-13 DIAGNOSIS — D171 Benign lipomatous neoplasm of skin and subcutaneous tissue of trunk: Secondary | ICD-10-CM | POA: Diagnosis not present

## 2022-04-13 DIAGNOSIS — D220 Melanocytic nevi of lip: Secondary | ICD-10-CM | POA: Diagnosis not present

## 2022-04-13 DIAGNOSIS — D229 Melanocytic nevi, unspecified: Secondary | ICD-10-CM

## 2022-04-13 DIAGNOSIS — D1720 Benign lipomatous neoplasm of skin and subcutaneous tissue of unspecified limb: Secondary | ICD-10-CM

## 2022-04-13 NOTE — Patient Instructions (Addendum)
Due to recent changes in healthcare laws, you may see results of your pathology and/or laboratory studies on MyChart before the doctors have had a chance to review them. We understand that in some cases there may be results that are confusing or concerning to you. Please understand that not all results are received at the same time and often the doctors may need to interpret multiple results in order to provide you with the best plan of care or course of treatment. Therefore, we ask that you please give us 2 business days to thoroughly review all your results before contacting the office for clarification. Should we see a critical lab result, you will be contacted sooner.   If You Need Anything After Your Visit  If you have any questions or concerns for your doctor, please call our main line at [phone] and press option 4 to reach your doctor's medical assistant. If no one answers, please leave a voicemail as directed and we will return your call as soon as possible. Messages left after 4 pm will be answered the following business day.   You may also send us a message via MyChart. We typically respond to MyChart messages within 1-2 business days.  For prescription refills, please ask your pharmacy to contact our office. Our fax number is [phone].  If you have an urgent issue when the clinic is closed that cannot wait until the next business day, you can page your doctor at the number below.    Please note that while we do our best to be available for urgent issues outside of office hours, we are not available 24/7.   If you have an urgent issue and are unable to reach us, you may choose to seek medical care at your doctor's office, retail clinic, urgent care center, or emergency room.  If you have a medical emergency, please immediately call 911 or go to the emergency department.  Pager Numbers  - Dr. Kowalski: [phone]  - Dr. Moye: [phone]  - Dr. Stewart:  [phone]  In the event of inclement weather, please call our main line at [phone] for an update on the status of any delays or closures.  Dermatology Medication Tips: Please keep the boxes that topical medications come in in order to help keep track of the instructions about where and how to use these. Pharmacies typically print the medication instructions only on the boxes and not directly on the medication tubes.   If your medication is too expensive, please contact our office at [phone] option 4 or send us a message through MyChart.   We are unable to tell what your co-pay for medications will be in advance as this is different depending on your insurance coverage. However, we may be able to find a substitute medication at lower cost or fill out paperwork to get insurance to cover a needed medication.   If a prior authorization is required to get your medication covered by your insurance company, please allow us 1-2 business days to complete this process.  Drug prices often vary depending on where the prescription is filled and some pharmacies may offer cheaper prices.  The website www.goodrx.com contains coupons for medications through different pharmacies. The prices here do not account for what the cost may be with help from insurance (it may be cheaper with your insurance), but the website can give you the price if you did not use any insurance.  - You can print the associated coupon and take it with   your prescription to the pharmacy.  - You may also stop by our office during regular business hours and pick up a GoodRx coupon card.  - If you need your prescription sent electronically to a different pharmacy, notify our office through [place] MyChart or by phone at [phone] option 4.     Si Usted Necesita Algo Despus de Su Visita  Tambin puede enviarnos un mensaje a travs de MyChart. Por lo general respondemos a los mensajes de MyChart en el transcurso de 1 a 2  das hbiles.  Para renovar recetas, por favor pida a su farmacia que se ponga en contacto con nuestra oficina. Nuestro nmero de fax es el [phone].  Si tiene un asunto urgente cuando la clnica est cerrada y que no puede esperar hasta el siguiente da hbil, puede llamar/localizar a su doctor(a) al nmero que aparece a continuacin.   Por favor, tenga en cuenta que aunque hacemos todo lo posible para estar disponibles para asuntos urgentes fuera del horario de oficina, no estamos disponibles las 24 horas del da, los 7 das de la semana.   Si tiene un problema urgente y no puede comunicarse con nosotros, puede optar por buscar atencin mdica  en el consultorio de su doctor(a), en una clnica privada, en un centro de atencin urgente o en una sala de emergencias.  Si tiene una emergencia mdica, por favor llame inmediatamente al 911 o vaya a la sala de emergencias.  Nmeros de bper  - Dr. Kowalski: [phone]  - Dra. Moye: [phone]  - Dra. Stewart: [phone]  En caso de inclemencias del tiempo, por favor llame a nuestra lnea principal al [phone] para una actualizacin sobre el estado de cualquier retraso o cierre.  Consejos para la medicacin en dermatologa: Por favor, guarde las cajas en las que vienen los medicamentos de uso tpico para ayudarle a seguir las instrucciones sobre dnde y cmo usarlos. Las farmacias generalmente imprimen las instrucciones del medicamento slo en las cajas y no directamente en los tubos del medicamento.   Si su medicamento es muy caro, por favor, pngase en contacto con nuestra oficina llamando al [phone] y presione la opcin 4 o envenos un mensaje a travs de MyChart.   No podemos decirle cul ser su copago por los medicamentos por adelantado ya que esto es diferente dependiendo de la cobertura de su seguro. Sin embargo, es posible que podamos encontrar un medicamento sustituto a menor costo o llenar un formulario para que el  seguro cubra el medicamento que se considera necesario.   Si se requiere una autorizacin previa para que su compaa de seguros cubra su medicamento, por favor permtanos de 1 a 2 das hbiles para completar este proceso.  Los precios de los medicamentos varan con frecuencia dependiendo del lugar de dnde se surte la receta y alguna farmacias pueden ofrecer precios ms baratos.  El sitio web www.goodrx.com tiene cupones para medicamentos de diferentes farmacias. Los precios aqu no tienen en cuenta lo que podra costar con la ayuda del seguro (puede ser ms barato con su seguro), pero el sitio web puede darle el precio si no utiliz ningn seguro.  - Puede imprimir el cupn correspondiente y llevarlo con su receta a la farmacia.  - Tambin puede pasar por nuestra oficina durante el horario de atencin regular y recoger una tarjeta de cupones de GoodRx.  - Si necesita que su receta se enve electrnicamente a una farmacia diferente, informe a nuestra oficina a travs de MyChart de    o por telfono llamando al [phone] y presione la opcin 4.  

## 2022-04-13 NOTE — Progress Notes (Signed)
   New Patient Visit  Subjective  Rebekah Parks is a 31 y.o. female who presents for the following: Irregular skin lesions (On the lower lip and legs - patient would like checked today). The patient has spots, moles and lesions to be evaluated, some may be new or changing and the patient has concerns that these could be cancer.  The following portions of the chart were reviewed this encounter and updated as appropriate:   Tobacco  Allergies  Meds  Problems  Med Hx  Surg Hx  Fam Hx     Review of Systems:  No other skin or systemic complaints except as noted in HPI or Assessment and Plan.  Objective  Well appearing patient in no apparent distress; mood and affect are within normal limits.  A focused examination was performed including the face and lower legs. Relevant physical exam findings are noted in the Assessment and Plan.  R of midline lower lip 0.4 x 0.3 flesh colored papule.  R ant thigh, L back 2.0 x 1.0 cm rubbery nodule of the R ant thigh 1.0 x 0.5 cm rubbery nodule of the L back   Assessment & Plan  Benign nevus R of midline lower lip Benign-appearing.  Observation.  Call clinic for new or changing lesions.  Recommend daily use of broad spectrum spf 30+ sunscreen to sun-exposed areas.    Lipoma of lower extremity, unspecified laterality R ant thigh, L back Benign, observe.  Discussed excision if bothersome.   Return if symptoms worsen or fail to improve.  Rebekah Parks, CMA, am acting as scribe for Sarina Ser, MD . Documentation: I have reviewed the above documentation for accuracy and completeness, and I agree with the above.  Sarina Ser, MD

## 2022-04-22 ENCOUNTER — Encounter: Payer: Self-pay | Admitting: Dermatology

## 2023-07-13 ENCOUNTER — Encounter: Payer: Self-pay | Admitting: Family Medicine

## 2023-07-13 NOTE — Telephone Encounter (Signed)
 Please advise.   JM

## 2023-07-24 ENCOUNTER — Encounter: Payer: Self-pay | Admitting: Family Medicine

## 2023-07-24 ENCOUNTER — Ambulatory Visit (INDEPENDENT_AMBULATORY_CARE_PROVIDER_SITE_OTHER): Payer: Medicaid Other | Admitting: Family Medicine

## 2023-07-24 VITALS — BP 138/86 | HR 93 | Ht 65.0 in | Wt 247.0 lb

## 2023-07-24 DIAGNOSIS — G478 Other sleep disorders: Secondary | ICD-10-CM | POA: Diagnosis not present

## 2023-07-24 DIAGNOSIS — M5416 Radiculopathy, lumbar region: Secondary | ICD-10-CM | POA: Diagnosis not present

## 2023-07-24 DIAGNOSIS — Z0000 Encounter for general adult medical examination without abnormal findings: Secondary | ICD-10-CM | POA: Diagnosis not present

## 2023-07-24 DIAGNOSIS — E785 Hyperlipidemia, unspecified: Secondary | ICD-10-CM | POA: Diagnosis not present

## 2023-07-24 DIAGNOSIS — G4733 Obstructive sleep apnea (adult) (pediatric): Secondary | ICD-10-CM | POA: Insufficient documentation

## 2023-07-24 DIAGNOSIS — Z124 Encounter for screening for malignant neoplasm of cervix: Secondary | ICD-10-CM | POA: Diagnosis not present

## 2023-07-24 DIAGNOSIS — Z7689 Persons encountering health services in other specified circumstances: Secondary | ICD-10-CM | POA: Insufficient documentation

## 2023-07-24 DIAGNOSIS — R7309 Other abnormal glucose: Secondary | ICD-10-CM | POA: Diagnosis not present

## 2023-07-24 DIAGNOSIS — R030 Elevated blood-pressure reading, without diagnosis of hypertension: Secondary | ICD-10-CM | POA: Insufficient documentation

## 2023-07-24 MED ORDER — DICLOFENAC SODIUM 50 MG PO TBEC: 50.0000 mg | DELAYED_RELEASE_TABLET | Freq: Two times a day (BID) | ORAL | 1 refills | Status: AC | PRN

## 2023-07-24 MED ORDER — SEMAGLUTIDE-WEIGHT MANAGEMENT 0.25 MG/0.5ML ~~LOC~~ SOAJ: 0.2500 mg | SUBCUTANEOUS | 0 refills | Status: AC

## 2023-07-24 NOTE — Assessment & Plan Note (Signed)
 Annual examination completed, risk stratification labs ordered, anticipatory guidance provided.  We will follow labs once resulted.

## 2023-07-24 NOTE — Progress Notes (Signed)
Annual Physical Exam Visit  Patient Information:  Patient ID: Rebekah Parks, female DOB: 04/09/1991 Age: 33 y.o. MRN: [id number]   Subjective:   CC: Annual Physical Exam  HPI:  Rebekah Parks is here for their annual physical.  I reviewed the past medical history, family history, social history, surgical history, and allergies today and changes were made as necessary.  Please see the problem list section below for additional details.  Past Medical History: Past Medical History:  Diagnosis Date   Blood type, Rh negative 02/15/2016   Chronic back pain    HNP,spondylosis/DDD/radiculopathy   Family history of coarctation of aorta 12/28/2015   GBS (group B Streptococcus carrier), +RV culture, currently pregnant 06/21/2016   Past Surgical History: Past Surgical History:  Procedure Laterality Date   WISDOM TOOTH EXTRACTION     Family History: Family History  Problem Relation Age of Onset   Healthy Mother    Healthy Father    Allergies: No Known Allergies Health Maintenance: Health Maintenance  Topic Date Due   COVID-19 Vaccine (1) Never done   Cervical Cancer Screening (HPV/Pap Cotest)  01/20/2020   INFLUENZA VACCINE  09/04/2023 (Originally 01/05/2023)   DTaP/Tdap/Td (2 - Td or Tdap) 03/11/2025   Hepatitis C Screening  Completed   HIV Screening  Completed   HPV VACCINES  Aged Out    HM Colonoscopy   This patient has no relevant Health Maintenance data.    Medications: Current Outpatient Medications on File Prior to Visit  Medication Sig Dispense Refill   ACETYLCYSTEINE PO Take by mouth.  Otc supplement     Menaquinone-7 (VITAMIN K2 PO) Take 1 tablet by mouth daily.     OLIVE LEAF EXTRACT PO Take 500 mg by mouth daily.     Psyllium Husk POWD by Does not apply route.     SUBOXONE 8-2 MG FILM Place under the tongue 2 (two) times daily.     Thiamine HCl (VITAMIN B1 PO) Take 1 tablet by mouth daily.     UNABLE TO FIND Take 1 tablet by mouth daily. Med Name: beet  supplement     No current facility-administered medications on file prior to visit.    Objective:   Vitals:   07/24/23 1408  BP: 138/86  Pulse: 93  SpO2: 96%   Vitals:   07/24/23 1408  Weight: 247 lb (112 kg)  Height: 5\' 5"  (1.651 m)   Body mass index is 41.1 kg/m.  General: Well Developed, well nourished, and in no acute distress.  Neuro: Alert and oriented x3, extra-ocular muscles intact, sensation grossly intact. Cranial nerves II through XII are grossly intact, motor, sensory, and coordinative functions are intact. HEENT: Normocephalic, atraumatic, neck supple, no masses, no lymphadenopathy, thyroid nonenlarged. Oropharynx, nasopharynx, external ear canals are unremarkable. Skin: Warm and dry, no rashes noted.  Cardiac: Regular rate and rhythm, no murmurs rubs or gallops. No peripheral edema. Pulses symmetric. Respiratory: Clear to auscultation bilaterally. Speaking in full sentences.  Abdominal: Soft, nontender, nondistended, positive bowel sounds, no masses, no organomegaly. Musculoskeletal: Stable, and with full range of motion.  Impression and Recommendations:   The patient was counselled, risk factors were discussed, and anticipatory guidance given.  Problem List Items Addressed This Visit     Bilateral lumbar radiculopathy   She experiences low back pain, which was previously severe enough to radiate to her foot but now occurs rarely. She manages it with stretching exercises learned from physical therapy and occasionally uses medication for pain relief,  especially during busy times or events like the state fair. She mentions occasional low back pain.  Low Back Pain Reports of occasional radiating pain, managed with stretching and exercises. -Continue current management and consider increasing frequency of exercises.      Relevant Medications   diclofenac (VOLTAREN) 50 MG EC tablet   Semaglutide-Weight Management 0.25 MG/0.5ML SOAJ   Cervical cancer screening    Cervical Cancer Screening Due for Pap exam. -Refer to gynecology for cervical cancer screening.      Relevant Orders   Ambulatory referral to Obstetrics / Gynecology   Elevated blood pressure reading without diagnosis of hypertension   She has a history of elevated BP with blood pressure readings consistently in the 130s/80s range, including reading todayof 138/86. She has been taking a supplement for hypertension for the past three months, which she believes has been beneficial. Her blood pressure tends to be elevated during her monthly visits to a specific location, which she is attempting to reduce to every month and a half. There is a family history of hypertension on her mother's side, which is well-managed by her mother.  Elevated BP Blood pressure readings consistently in the 130s/80s. Family history of hypertension. Currently taking an OTC supplement for blood pressure management. -Continue current management and monitor blood pressure.      Encounter for weight management   She is attempting to walk daily for 30 minutes and improve her diet but feels stuck in her weight loss efforts. She is interested in exploring options to aid weight loss, noting that recent weather has hindered her walking routine.  Weight Management Desire for assistance with weight loss. Currently trying to increase physical activity. -Start Wegovy 0.25mg  for weight loss, with a plan to increase dose after one month if tolerated and effective. -Return in 1 month.      Relevant Medications   Semaglutide-Weight Management 0.25 MG/0.5ML SOAJ   Healthcare maintenance - Primary   Annual examination completed, risk stratification labs ordered, anticipatory guidance provided.  We will follow labs once resulted.      Relevant Orders   CBC   Hyperlipidemia   Hyperlipidemia Slightly elevated cholesterol levels noted in May 2023. -Order updated blood work to monitor cholesterol levels.      Relevant Orders    Apo A1 + B + Ratio   Comprehensive metabolic panel   Lipid panel   Non-restorative sleep   She experiences sleep disturbances characterized by nocturia, waking shortly after falling asleep with palpitations, and daytime fatigue. Her husband has noted snoring and episodes of gasping for air during sleep. These symptoms have led to anxiety about sleeping due to concerns about potential health risks. She suspects sleep apnea, particularly since her husband has it. She reports daytime fatigue, dry mouth upon waking, and sleep-related anxiety. No general anxiety.  Suspected Sleep Apnea Reports of snoring, gasping for air, dry mouth, nocturia, and daytime fatigue. Anxiety about sleep due to these symptoms. Family history of hypertension. -Refer to sleep medicine group to confirm diagnosis.      Relevant Orders   Ambulatory referral to Pulmonology   Other Visit Diagnoses       Abnormal glucose       Relevant Orders   Comprehensive metabolic panel   Hemoglobin A1c        Orders & Medications Medications:  Meds ordered this encounter  Medications   diclofenac (VOLTAREN) 50 MG EC tablet    Sig: Take 1 tablet (50 mg total) by mouth 2 (  two) times daily as needed.    Dispense:  60 tablet    Refill:  1   Semaglutide-Weight Management 0.25 MG/0.5ML SOAJ    Sig: Inject 0.25 mg into the skin once a week for 28 days.    Dispense:  2 mL    Refill:  0   Orders Placed This Encounter  Procedures   Apo A1 + B + Ratio   CBC   Comprehensive metabolic panel   Hemoglobin A1c   Lipid panel   Ambulatory referral to Pulmonology   Ambulatory referral to Obstetrics / Gynecology     Return in about 1 month (around 08/21/2023) for Weight MGMT f/u.    Jerrol Banana, MD, [facility]   Primary Care Sports Medicine Primary [facility] and Sports Medicine at [facility]

## 2023-07-24 NOTE — Patient Instructions (Addendum)
-   Obtain fasting labs with orders provided (can have water or black coffee but otherwise no food or drink x 8 hours before labs) - Review information provided - Attend eye doctor annually, dentist every 6 months, work towards or maintain 30 minutes of moderate intensity physical activity at least 5 days per week, and consume a balanced diet - Return in 1 year for physical - Contact us for any questions between now and then   - Continue current management for lumbar radiculopathy, consider increasing exercise frequency. - Continue current management for low back pain, consider increasing exercise frequency. - Refer to gynecology for cervical cancer screening. - Continue current blood pressure management, monitor BP. - Start Wegovy 0.25mg  for weight loss, increase dose after 1 month if effective. Return in 1 month. - Follow up on lab results once available. - Order updated blood work to monitor cholesterol levels. - Refer to sleep medicine group to confirm diagnosis of sleep apnea.

## 2023-07-24 NOTE — Assessment & Plan Note (Signed)
She experiences low back pain, which was previously severe enough to radiate to her foot but now occurs rarely. She manages it with stretching exercises learned from physical therapy and occasionally uses medication for pain relief, especially during busy times or events like the state fair. She mentions occasional low back pain.  Low Back Pain Reports of occasional radiating pain, managed with stretching and exercises. -Continue current management and consider increasing frequency of exercises.

## 2023-07-24 NOTE — Assessment & Plan Note (Signed)
She experiences sleep disturbances characterized by nocturia, waking shortly after falling asleep with palpitations, and daytime fatigue. Her husband has noted snoring and episodes of gasping for air during sleep. These symptoms have led to anxiety about sleeping due to concerns about potential health risks. She suspects sleep apnea, particularly since her husband has it. She reports daytime fatigue, dry mouth upon waking, and sleep-related anxiety. No general anxiety.  Suspected Sleep Apnea Reports of snoring, gasping for air, dry mouth, nocturia, and daytime fatigue. Anxiety about sleep due to these symptoms. Family history of hypertension. -Refer to sleep medicine group to confirm diagnosis.

## 2023-07-24 NOTE — Assessment & Plan Note (Signed)
Hyperlipidemia Slightly elevated cholesterol levels noted in May 2023. -Order updated blood work to monitor cholesterol levels.

## 2023-07-24 NOTE — Assessment & Plan Note (Signed)
She is attempting to walk daily for 30 minutes and improve her diet but feels stuck in her weight loss efforts. She is interested in exploring options to aid weight loss, noting that recent weather has hindered her walking routine.  Weight Management Desire for assistance with weight loss. Currently trying to increase physical activity. -Start [facility] 0.25mg  for weight loss, with a plan to increase dose after one month if tolerated and effective. -Return in 1 month.

## 2023-07-24 NOTE — Assessment & Plan Note (Signed)
She has a history of elevated BP with blood pressure readings consistently in the 130s/80s range, including reading todayof 138/86. She has been taking a supplement for hypertension for the past three months, which she believes has been beneficial. Her blood pressure tends to be elevated during her monthly visits to a specific location, which she is attempting to reduce to every month and a half. There is a family history of hypertension on her mother's side, which is well-managed by her mother.  Elevated BP Blood pressure readings consistently in the 130s/80s. Family history of hypertension. Currently taking an OTC supplement for blood pressure management. -Continue current management and monitor blood pressure.

## 2023-07-24 NOTE — Assessment & Plan Note (Signed)
Cervical Cancer Screening Due for Pap exam. -Refer to gynecology for cervical cancer screening.

## 2023-07-25 ENCOUNTER — Encounter: Payer: Self-pay | Admitting: Family Medicine

## 2023-07-25 LAB — COMPREHENSIVE METABOLIC PANEL
ALT: 10 [IU]/L (ref 0–32)
AST: 12 [IU]/L (ref 0–40)
Albumin: 4.2 g/dL (ref 3.9–4.9)
Alkaline Phosphatase: 58 [IU]/L (ref 44–121)
BUN/Creatinine Ratio: 19 (ref 9–23)
BUN: 14 mg/dL (ref 6–20)
Bilirubin Total: 0.4 mg/dL (ref 0.0–1.2)
CO2: 25 mmol/L (ref 20–29)
Calcium: 9.1 mg/dL (ref 8.7–10.2)
Chloride: 100 mmol/L (ref 96–106)
Creatinine, Ser: 0.74 mg/dL (ref 0.57–1.00)
Globulin, Total: 2 g/dL (ref 1.5–4.5)
Glucose: 89 mg/dL (ref 70–99)
Potassium: 5 mmol/L (ref 3.5–5.2)
Sodium: 137 mmol/L (ref 134–144)
Total Protein: 6.2 g/dL (ref 6.0–8.5)
eGFR: 109 mL/min/{1.73_m2} (ref >59)

## 2023-07-25 LAB — APO A1 + B + RATIO
Apolipo. B/A-1 Ratio: 0.6 {ratio} (ref 0.0–0.6)
Apolipoprotein A-1: 160 mg/dL (ref 116–209)
Apolipoprotein B: 97 mg/dL — ABNORMAL HIGH (ref <90)

## 2023-07-25 LAB — CBC
Hematocrit: 41.5 % (ref 34.0–46.6)
Hemoglobin: 13.8 g/dL (ref 11.1–15.9)
MCH: 28.4 pg (ref 26.6–33.0)
MCHC: 33.3 g/dL (ref 31.5–35.7)
MCV: 85 fL (ref 79–97)
Platelets: 272 10*3/uL (ref 150–450)
RBC: 4.86 x10E6/uL (ref 3.77–5.28)
RDW: 12.7 % (ref 11.7–15.4)
WBC: 6.1 10*3/uL (ref 3.4–10.8)

## 2023-07-25 LAB — LIPID PANEL
Chol/HDL Ratio: 3.4 {ratio} (ref 0.0–4.4)
Cholesterol, Total: 205 mg/dL — ABNORMAL HIGH (ref 100–199)
HDL: 60 mg/dL (ref >39)
LDL Chol Calc (NIH): 122 mg/dL — ABNORMAL HIGH (ref 0–99)
Triglycerides: 128 mg/dL (ref 0–149)
VLDL Cholesterol Cal: 23 mg/dL (ref 5–40)

## 2023-07-25 LAB — HEMOGLOBIN A1C
Est. average glucose Bld gHb Est-mCnc: 105 mg/dL
Hgb A1c MFr Bld: 5.3 % (ref 4.8–5.6)

## 2023-07-26 DIAGNOSIS — Z6841 Body Mass Index (BMI) 40.0 and over, adult: Secondary | ICD-10-CM | POA: Insufficient documentation

## 2023-07-26 DIAGNOSIS — E66812 Obesity, class 2: Secondary | ICD-10-CM | POA: Insufficient documentation

## 2023-07-27 NOTE — Telephone Encounter (Signed)
PT response

## 2023-07-28 ENCOUNTER — Encounter: Payer: Self-pay | Admitting: Sleep Medicine

## 2023-07-31 ENCOUNTER — Telehealth: Payer: Self-pay

## 2023-08-01 ENCOUNTER — Ambulatory Visit: Payer: Medicaid Other | Admitting: Sleep Medicine

## 2023-08-01 ENCOUNTER — Encounter: Payer: Self-pay | Admitting: Sleep Medicine

## 2023-08-01 VITALS — BP 122/80 | HR 92 | Temp 97.1°F | Ht 65.0 in | Wt 245.4 lb

## 2023-08-01 DIAGNOSIS — Z6841 Body Mass Index (BMI) 40.0 and over, adult: Secondary | ICD-10-CM | POA: Diagnosis not present

## 2023-08-01 DIAGNOSIS — G4733 Obstructive sleep apnea (adult) (pediatric): Secondary | ICD-10-CM

## 2023-08-01 DIAGNOSIS — R400 Somnolence: Secondary | ICD-10-CM

## 2023-08-01 DIAGNOSIS — R0683 Snoring: Secondary | ICD-10-CM

## 2023-08-01 NOTE — Patient Instructions (Signed)
 Marland Kitchen

## 2023-08-01 NOTE — Progress Notes (Signed)
 Name:Rebekah Parks MRN: [id number] DOB: February 23, 1991   CHIEF COMPLAINT:  EXCESSIVE DAYTIME SLEEPINESS   HISTORY OF PRESENT ILLNESS:  Mrs. Duerksen is a 33 y.o. w/ a h/o morbid obesity who presents for c/o loud snoring and excessive daytime sleepiness which has been present for several years. Reports nocturnal awakenings due to nocturia, however does not have difficulty falling back to sleep. Reports significant weight fluctuations. Admits to morning headaches, night sweats and dry mouth. Denies RLS symptoms or dream enactment. Denies a family history of sleep apnea. Reports occasional drowsy driving. Drinks 1 cup of coffee daily, occasional alcohol use, denies tobacco or illicit drug use.   Bedtime 2-3 am Sleep onset 20 mins Rise time 9 am   EPWORTH SLEEP SCORE 10    08/01/2023    2:00 PM  Results of the Epworth flowsheet  Sitting and reading 3  Watching TV 2  Sitting, inactive in a public place (e.g. a theatre or a meeting) 0  As a passenger in a car for an hour without a break 1  Lying down to rest in the afternoon when circumstances permit 3  Sitting and talking to someone 0  Sitting quietly after a lunch without alcohol 1  In a car, while stopped for a few minutes in traffic 0  Total score 10     PAST MEDICAL HISTORY :   has a past medical history of Blood type, Rh negative (02/15/2016), Chronic back pain, Family history of coarctation of aorta (12/28/2015), and GBS (group B Streptococcus carrier), +RV culture, currently pregnant (06/21/2016).  has a past surgical history that includes Wisdom tooth extraction. Prior to Admission medications   Medication Sig Start Date End Date Taking? Authorizing Provider  ACETYLCYSTEINE PO Take by mouth.  Otc supplement   Yes [provider]  diclofenac (VOLTAREN) 50 MG EC tablet Take 1 tablet (50 mg total) by mouth 2 (two) times daily as needed. 07/24/23  Yes Jerrol Banana, MD  Menaquinone-7 (VITAMIN K2 PO) Take 1  tablet by mouth daily.   Yes [provider]  OLIVE LEAF EXTRACT PO Take 500 mg by mouth daily.   Yes [provider]  Psyllium Husk POWD by Does not apply route.   Yes [provider]  SUBOXONE 8-2 MG FILM Place under the tongue 2 (two) times daily. 09/01/21  Yes [provider]  Thiamine HCl (VITAMIN B1 PO) Take 1 tablet by mouth daily.   Yes [provider]  UNABLE TO FIND Take 1 tablet by mouth daily. Med Name: beet supplement   Yes [provider]  Semaglutide-Weight Management 0.25 MG/0.5ML SOAJ Inject 0.25 mg into the skin once a week for 28 days. Patient not taking: Reported on 08/01/2023 07/24/23 08/21/23  Jerrol Banana, MD   No Known Allergies  FAMILY HISTORY:  family history includes Healthy in her father and mother. SOCIAL HISTORY:  reports that she has quit smoking. Her smoking use included cigarettes. She has a 5 pack-year smoking history. She has never used smokeless tobacco. She reports that she does not currently use drugs after having used the following drugs: Marijuana. She reports that she does not drink alcohol.   Review of Systems:  Gen:  Denies  fever, sweats, chills weight loss  HEENT: Denies blurred vision, double vision, ear pain, eye pain, hearing loss, nose bleeds, sore throat Cardiac:  No dizziness, chest pain or heaviness, chest tightness,edema, No JVD Resp:   No cough, -sputum production, -shortness  of breath,-wheezing, -hemoptysis,  Gi: Denies swallowing difficulty, stomach pain, nausea or vomiting, diarrhea, constipation, bowel incontinence Gu:  Denies bladder incontinence, burning urine Ext:   Denies Joint pain, stiffness or swelling Skin: Denies  skin rash, easy bruising or bleeding or hives Endoc:  Denies polyuria, polydipsia , polyphagia or weight change Psych:   Denies depression, insomnia or hallucinations  Other:  All other systems negative  VITAL SIGNS: BP 122/80 (BP Location: Right Arm,  Cuff Size: Large)   Pulse 92   Temp (!) 97.1 F (36.2 C)   Ht 5\' 5"  (1.651 m)   Wt 245 lb 6.4 oz (111.3 kg)   LMP 07/11/2023 (Approximate)   SpO2 99%   BMI 40.84 kg/m    Physical Examination:   General Appearance: No distress  EYES PERRLA, EOM intact.   NECK Supple, No JVD Mallampati IV Pulmonary: normal breath sounds, No wheezing.  CardiovascularNormal S1,S2.  No m/r/g.   Abdomen: Benign, Soft, non-tender. Skin:   warm, no rashes, no ecchymosis  Extremities: normal, no cyanosis, clubbing. Neuro:without focal findings,  speech normal  PSYCHIATRIC: Mood, affect within normal limits.   ASSESSMENT AND PLAN  OSA I suspect that OSA is likely present due to clinical presentation. Discussed the consequences of untreated sleep apnea. Advised not to drive drowsy for safety of patient and others. Will complete further evaluation with a home sleep study and follow up to review results.    Morbid obesity Counseled patient on diet and lifestyle modification.     MEDICATION ADJUSTMENTS/LABS AND TESTS ORDERED: Recommend Sleep Study   Patient  satisfied with Plan of action and management. All questions answered  Follow up to review HST results and treatment plan.   I spent a total of 31 minutes reviewing chart data, face-to-face evaluation with the patient, counseling and coordination of care as detailed above.    Tempie Hoist, M.D.  Sleep Medicine [facility]

## 2023-08-01 NOTE — Telephone Encounter (Signed)
 Please schedule patient per her availability for a VV. Thank you.

## 2023-08-01 NOTE — Telephone Encounter (Signed)
 Prior auth for wegovy was denied.   JM

## 2023-08-02 NOTE — Telephone Encounter (Signed)
 Please review.  KP

## 2023-08-02 NOTE — Telephone Encounter (Signed)
 Can we get her scheduled for virtual visit when it's a good time for her thank you.  JM

## 2023-08-03 ENCOUNTER — Encounter: Payer: Self-pay | Admitting: Family Medicine

## 2023-08-03 ENCOUNTER — Telehealth: Payer: Medicaid Other | Admitting: Family Medicine

## 2023-08-03 ENCOUNTER — Ambulatory Visit: Payer: Medicaid Other | Admitting: Family Medicine

## 2023-08-03 VITALS — Ht 65.0 in | Wt 245.4 lb

## 2023-08-03 DIAGNOSIS — R030 Elevated blood-pressure reading, without diagnosis of hypertension: Secondary | ICD-10-CM | POA: Diagnosis not present

## 2023-08-03 DIAGNOSIS — G478 Other sleep disorders: Secondary | ICD-10-CM

## 2023-08-03 DIAGNOSIS — Z7689 Persons encountering health services in other specified circumstances: Secondary | ICD-10-CM

## 2023-08-03 DIAGNOSIS — Z6841 Body Mass Index (BMI) 40.0 and over, adult: Secondary | ICD-10-CM

## 2023-08-03 MED ORDER — METFORMIN HCL ER 500 MG PO TB24: 500.0000 mg | ORAL_TABLET | Freq: Every day | ORAL | 2 refills | Status: DC

## 2023-08-03 MED ORDER — BUPROPION HCL ER (XL) 150 MG PO TB24: ORAL_TABLET | ORAL | 2 refills | Status: DC

## 2023-08-03 NOTE — Assessment & Plan Note (Signed)
 See additional assessment(s) for plan details.

## 2023-08-03 NOTE — Assessment & Plan Note (Addendum)
 History of Present Illness Rebekah Parks is a 33 year old female who presents for weight management .  She is concerned about weight fluctuation. She is interested in weight management options, though insurance coverage is a concern.  We reviewed current weight management medications in light of her current medications and medical history.  Assessment and Plan Weight management Discussed the complexity of weight management and the role of medications in facilitating weight loss. Patient is motivated to try medications in addition to lifestyle modifications. -Start Bupropion 150mg  daily for 1 week, then increase to 300mg  daily if tolerated.  -Start Metformin 500mg  daily. Gradually increase dose every week as tolerated, up to 2000mg  daily. Monitor for gastrointestinal side effects.  Follow-up in 3 months for in-person visit and weight check. Patient to reach out with any questions or concerns in the interim.

## 2023-08-03 NOTE — Assessment & Plan Note (Signed)
 History of Present Illness Rebekah Parks is a 33 year old female who presents with sleep issues and weight management concerns.  She is experiencing sleep disturbances and is awaiting a sleep study kit to be sent via mail. Her husband has sleep apnea, which has prompted discussions about the potential for mild sleep apnea and its symptoms in her case.  She is concerned about weight fluctuation and its possible connection to her sleep issues. She believes her body might be under physical stress, affecting her weight. She is interested in weight management options, though insurance coverage is a concern. She is currently on Suboxone and is considering bupropion for its potential benefits in reducing cravings and aiding weight loss. She is also considering metformin for weight management, acknowledging its potential gastrointestinal side effects.  Assessment and Plan Obesity Discussed the complexity of weight management and the role of medications in facilitating weight loss. Patient is motivated to try medications in addition to lifestyle modifications. -Start Bupropion 150mg  daily for 1 week, then increase to 150mg  twice daily if tolerated. Monitor for decreased cravings and increased satiety. -Start Metformin 500mg  daily. Gradually increase dose every few days as tolerated, up to 2000mg  daily. Monitor for gastrointestinal side effects.  Sleep Apnea Patient is awaiting a home sleep study. Discussed the importance of treating even mild sleep apnea if symptomatic. -Continue to follow up with sleep medicine for management of sleep apnea.  Follow-up in 3 months for in-person visit and weight check. Patient to reach out with any questions or concerns in the interim.

## 2023-08-03 NOTE — Assessment & Plan Note (Signed)
 Sleep Apnea Patient is awaiting a home sleep study. Discussed the importance of treating even mild sleep apnea if symptomatic. -Continue to follow up with sleep medicine for management of sleep apnea. -We will follow peripherally on this issue.

## 2023-08-03 NOTE — Patient Instructions (Addendum)
 Plan   Weight Management  1. Start Bupropion 150mg  daily for 1 week, then increase to 2 tablets (300 mg total) daily if tolerated. 2. Start Metformin 500mg  daily. Gradually increase dose weekly as tolerated, up to 2000mg  daily. Monitor for gastrointestinal side effects. 3. Continue with lifestyle modifications.  Sleep Apnea  1. Await results from the home sleep study. 2. Follow up with sleep medicine for management.  Follow-Up  1. Schedule an in-person visit and weight check in 3 months. 2. Contact us with any questions or concerns in the meantime.

## 2023-08-03 NOTE — Progress Notes (Signed)
 Primary Care / Sports Medicine Virtual Visit  Patient Information:  Patient ID: Rebekah Parks, female DOB: Jan 29, 1991 Age: 33 y.o. MRN: [id number]   Rebekah Parks is a pleasant 33 y.o. female presenting with the following:  Chief Complaint  Patient presents with   Obesity    Patient wants to discuss medication options for weight loss.     Review of Systems: No fevers, chills, night sweats, weight loss, chest pain, or shortness of breath.   Patient Active Problem List   Diagnosis Date Noted   Body mass index 40.0-44.9, adult (HCC) 07/26/2023   Hyperlipidemia 07/24/2023   Encounter for weight management 07/24/2023   Non-restorative sleep 07/24/2023   Elevated blood pressure reading without diagnosis of hypertension 07/24/2023   Healthcare maintenance 11/02/2021   Bilateral lumbar radiculopathy 09/07/2021   Cervical cancer screening 09/07/2021   Diastasis recti 09/07/2021   Skin lesions 09/07/2021   H/O opioid abuse (HCC) 10/22/2013   Past Medical History:  Diagnosis Date   Blood type, Rh negative 02/15/2016   Chronic back pain    HNP,spondylosis/DDD/radiculopathy   Family history of coarctation of aorta 12/28/2015   GBS (group B Streptococcus carrier), +RV culture, currently pregnant 06/21/2016   Outpatient Encounter Medications as of 08/03/2023  Medication Sig   ACETYLCYSTEINE PO Take by mouth.  Otc supplement   buPROPion (WELLBUTRIN XL) 150 MG 24 hr tablet 1 tablet daily for a week then 2 tabs daily thereafter   Menaquinone-7 (VITAMIN K2 PO) Take 1 tablet by mouth daily.   metFORMIN (GLUCOPHAGE-XR) 500 MG 24 hr tablet Take 1 tablet (500 mg total) by mouth daily with breakfast. Increase by 1 tablet (500 mg) weekly as tolerated to a maximum of 4 tablets (2000 mg total) daily.   OLIVE LEAF EXTRACT PO Take 500 mg by mouth daily.   Psyllium Husk POWD by Does not apply route.   SUBOXONE 8-2 MG FILM Place under the tongue 2 (two) times daily.   Thiamine HCl (VITAMIN B1  PO) Take 1 tablet by mouth daily.   diclofenac (VOLTAREN) 50 MG EC tablet Take 1 tablet (50 mg total) by mouth 2 (two) times daily as needed. (Patient not taking: Reported on 08/03/2023)   Semaglutide-Weight Management 0.25 MG/0.5ML SOAJ Inject 0.25 mg into the skin once a week for 28 days. (Patient not taking: Reported on 08/03/2023)   UNABLE TO FIND Take 1 tablet by mouth daily. Med Name: beet supplement   No facility-administered encounter medications on file as of 08/03/2023.   Past Surgical History:  Procedure Laterality Date   WISDOM TOOTH EXTRACTION      Virtual Visit via MyChart Video:   I connected with Wyn Quaker on 08/03/23 via MyChart Video and verified that I am speaking with the correct person using appropriate identifiers.   The limitations, risks, security and privacy concerns of performing an evaluation and management service by MyChart Video, including the higher likelihood of inaccurate diagnoses and treatments, and the availability of in person appointments were reviewed. The possible need of an additional face-to-face encounter for complete and high quality delivery of care was discussed. The patient was also made aware that there may be a patient responsible charge related to this service. The patient expressed understanding and wishes to proceed.  Provider location is in medical facility. Patient location is at their home, different from provider location. People involved in care of the patient during this telehealth encounter were myself, my nurse/medical assistant, and my front office/scheduling team member.  Objective findings:   General: Speaking full sentences, no audible heavy breathing. Sounds alert and appropriately interactive. Well-appearing. Face symmetric. Extraocular movements intact. Pupils equal and round. No nasal flaring or accessory muscle use visualized.  Independent interpretation of notes and tests performed by another provider:    None  Pertinent History, Exam, Impression, and Recommendations:   Problem List Items Addressed This Visit     Body mass index 40.0-44.9, adult (HCC)   History of Present Illness Rebekah Parks is a 33 year old female who presents for weight management .  She is concerned about weight fluctuation. She is interested in weight management options, though insurance coverage is a concern.  We reviewed current weight management medications in light of her current medications and medical history.  Assessment and Plan Weight management Discussed the complexity of weight management and the role of medications in facilitating weight loss. Patient is motivated to try medications in addition to lifestyle modifications. -Start Bupropion 150mg  daily for 1 week, then increase to 300mg  daily if tolerated.  -Start Metformin 500mg  daily. Gradually increase dose every week as tolerated, up to 2000mg  daily. Monitor for gastrointestinal side effects.  Follow-up in 3 months for in-person visit and weight check. Patient to reach out with any questions or concerns in the interim.      Relevant Medications   metFORMIN (GLUCOPHAGE-XR) 500 MG 24 hr tablet   Elevated blood pressure reading without diagnosis of hypertension   History of Present Illness Rebekah Parks is a 32 year old female who presents with sleep issues and weight management concerns.  She is experiencing sleep disturbances and is awaiting a sleep study kit to be sent via mail. Her husband has sleep apnea, which has prompted discussions about the potential for mild sleep apnea and its symptoms in her case.  She is concerned about weight fluctuation and its possible connection to her sleep issues. She believes her body might be under physical stress, affecting her weight. She is interested in weight management options, though insurance coverage is a concern. She is currently on Suboxone and is considering bupropion for its potential benefits in  reducing cravings and aiding weight loss. She is also considering metformin for weight management, acknowledging its potential gastrointestinal side effects.  Assessment and Plan Obesity Discussed the complexity of weight management and the role of medications in facilitating weight loss. Patient is motivated to try medications in addition to lifestyle modifications. -Start Bupropion 150mg  daily for 1 week, then increase to 150mg  twice daily if tolerated. Monitor for decreased cravings and increased satiety. -Start Metformin 500mg  daily. Gradually increase dose every few days as tolerated, up to 2000mg  daily. Monitor for gastrointestinal side effects.  Sleep Apnea Patient is awaiting a home sleep study. Discussed the importance of treating even mild sleep apnea if symptomatic. -Continue to follow up with sleep medicine for management of sleep apnea.  Follow-up in 3 months for in-person visit and weight check. Patient to reach out with any questions or concerns in the interim.      Encounter for weight management - Primary   See additional assessment(s) for plan details.      Relevant Medications   buPROPion (WELLBUTRIN XL) 150 MG 24 hr tablet   metFORMIN (GLUCOPHAGE-XR) 500 MG 24 hr tablet   Non-restorative sleep   Sleep Apnea Patient is awaiting a home sleep study. Discussed the importance of treating even mild sleep apnea if symptomatic. -Continue to follow up with sleep medicine for management of sleep apnea. -We will  follow peripherally on this issue.        Orders & Medications Medications:  Meds ordered this encounter  Medications   buPROPion (WELLBUTRIN XL) 150 MG 24 hr tablet    Sig: 1 tablet daily for a week then 2 tabs daily thereafter    Dispense:  60 tablet    Refill:  2   metFORMIN (GLUCOPHAGE-XR) 500 MG 24 hr tablet    Sig: Take 1 tablet (500 mg total) by mouth daily with breakfast. Increase by 1 tablet (500 mg) weekly as tolerated to a maximum of 4 tablets (2000  mg total) daily.    Dispense:  90 tablet    Refill:  2   No orders of the defined types were placed in this encounter.    I discussed the above assessment and treatment plan with the patient. The patient was provided an opportunity to ask questions and all were answered. The patient agreed with the plan and demonstrated an understanding of the instructions.   The patient was advised to call back or seek an in-person evaluation if the symptoms worsen or if the condition fails to improve as anticipated.   I provided a total time of 30 minutes including both face-to-face and non-face-to-face time on 08/03/2023 inclusive of time utilized for medical chart review, information gathering, care coordination with staff, and documentation completion.    Jerrol Banana, MD, [facility]   Primary Care Sports Medicine Primary Care and Sports Medicine at [facility]

## 2023-08-07 ENCOUNTER — Telehealth: Payer: Self-pay

## 2023-08-07 NOTE — Telephone Encounter (Signed)
 Complete PA on covermymeds for Bupropion HCI ER (XL) 150 MG ER tablets 60 per 30 days (Key: [facility]) PA Case ID #: [id number] Rx #: [id number]

## 2023-08-08 NOTE — Telephone Encounter (Signed)
 PA denied due to not being prescribed for FDA approved indication "major depression".

## 2023-08-18 NOTE — Telephone Encounter (Signed)
 Only office I know is a place in [place] called [facility].

## 2023-08-18 NOTE — Telephone Encounter (Signed)
 [facility] no [facility] provides [facility] trearment.  ADS in [place]  [phone] [facility] in [place] [phone] [facility] treatment center in [place] [phone]

## 2023-08-18 NOTE — Telephone Encounter (Signed)
 Please review. JM

## 2023-08-21 ENCOUNTER — Other Ambulatory Visit: Payer: Self-pay | Admitting: Family Medicine

## 2023-08-21 DIAGNOSIS — F325 Major depressive disorder, single episode, in full remission: Secondary | ICD-10-CM

## 2023-08-21 MED ORDER — BUPROPION HCL ER (XL) 150 MG PO TB24
150.0000 mg | ORAL_TABLET | Freq: Every day | ORAL | 0 refills | Status: DC
Start: 2023-08-21 — End: 2023-08-30

## 2023-08-21 NOTE — Telephone Encounter (Signed)
 Please review. Location listed above.  KP

## 2023-08-24 ENCOUNTER — Ambulatory Visit: Payer: Medicaid Other | Admitting: Family Medicine

## 2023-08-28 NOTE — Telephone Encounter (Signed)
 Please review.  KP

## 2023-08-30 ENCOUNTER — Other Ambulatory Visit: Payer: Self-pay

## 2023-08-30 MED ORDER — BUPROPION HCL ER (XL) 300 MG PO TB24: 300.0000 mg | ORAL_TABLET | Freq: Every day | ORAL | 0 refills | Status: DC

## 2023-08-31 ENCOUNTER — Ambulatory Visit

## 2023-08-31 DIAGNOSIS — G4733 Obstructive sleep apnea (adult) (pediatric): Secondary | ICD-10-CM

## 2023-09-25 ENCOUNTER — Ambulatory Visit: Admitting: Sleep Medicine

## 2023-09-27 ENCOUNTER — Encounter: Payer: Self-pay | Admitting: Sleep Medicine

## 2023-09-27 ENCOUNTER — Ambulatory Visit: Admitting: Sleep Medicine

## 2023-09-27 VITALS — BP 130/84 | HR 73 | Temp 97.6°F | Ht 65.0 in | Wt 236.2 lb

## 2023-09-27 DIAGNOSIS — G4733 Obstructive sleep apnea (adult) (pediatric): Secondary | ICD-10-CM | POA: Diagnosis not present

## 2023-09-27 DIAGNOSIS — Z87891 Personal history of nicotine dependence: Secondary | ICD-10-CM | POA: Diagnosis not present

## 2023-09-27 DIAGNOSIS — Z6839 Body mass index (BMI) 39.0-39.9, adult: Secondary | ICD-10-CM

## 2023-09-27 DIAGNOSIS — E66812 Obesity, class 2: Secondary | ICD-10-CM | POA: Diagnosis not present

## 2023-09-27 NOTE — Patient Instructions (Signed)

## 2023-09-27 NOTE — Progress Notes (Unsigned)
 Name:Rebekah Parks MRN: [id number] DOB: 1990/09/04   CHIEF COMPLAINT:  EXCESSIVE DAYTIME SLEEPINESS   HISTORY OF PRESENT ILLNESS:  Rebekah Parks is a 33 y.o. w/ a h/o obesity who presents to follow up on HST results. The patient underwent HST which revealed mild OSA (AHI 6, O2 nadir 83%).     EPWORTH SLEEP SCORE    08/01/2023    2:00 PM  Results of the Epworth flowsheet  Sitting and reading 3  Watching TV 2  Sitting, inactive in a public place (e.g. a theatre or a meeting) 0  As a passenger in a car for an hour without a break 1  Lying down to rest in the afternoon when circumstances permit 3  Sitting and talking to someone 0  Sitting quietly after a lunch without alcohol 1  In a car, while stopped for a few minutes in traffic 0  Total score 10     PAST MEDICAL HISTORY :   has a past medical history of Blood type, Rh negative (02/15/2016), Chronic back pain, Family history of coarctation of aorta (12/28/2015), and GBS (group B Streptococcus carrier), +RV culture, currently pregnant (06/21/2016).  has a past surgical history that includes Wisdom tooth extraction. Prior to Admission medications   Medication Sig Start Date End Date Taking? Authorizing Provider  ACETYLCYSTEINE PO Take by mouth.  Otc supplement   Yes [provider]  buPROPion  (WELLBUTRIN  XL) 300 MG 24 hr tablet Take 1 tablet (300 mg total) by mouth daily. 08/30/23  Yes Ma Saupe, MD  diclofenac  (VOLTAREN ) 50 MG EC tablet Take 1 tablet (50 mg total) by mouth 2 (two) times daily as needed. 07/24/23  Yes Matthews, Jason J, MD  Menaquinone-7 (VITAMIN K2 PO) Take 1 tablet by mouth daily.   Yes [provider]  metFORMIN  (GLUCOPHAGE -XR) 500 MG 24 hr tablet Take 1 tablet (500 mg total) by mouth daily with breakfast. Increase by 1 tablet (500 mg) weekly as tolerated to a maximum of 4 tablets (2000 mg total) daily. 08/03/23  Yes Ma Saupe, MD  OLIVE LEAF EXTRACT PO Take 500 mg by mouth  daily.   Yes [provider]  Psyllium Husk POWD by Does not apply route.   Yes [provider]  SUBOXONE 8-2 MG FILM Place under the tongue 2 (two) times daily. 09/01/21  Yes [provider]  Thiamine HCl (VITAMIN B1 PO) Take 1 tablet by mouth daily.   Yes [provider]  UNABLE TO FIND Take 1 tablet by mouth daily. Med Name: beet supplement   Yes [provider]   No Known Allergies  FAMILY HISTORY:  family history includes Healthy in her father and mother. SOCIAL HISTORY:  reports that she has quit smoking. Her smoking use included cigarettes. She has a 5 pack-year smoking history. She has never used smokeless tobacco. She reports that she does not currently use drugs after having used the following drugs: Marijuana. She reports that she does not drink alcohol.   Review of Systems:  Gen:  Denies  fever, sweats, chills weight loss  HEENT: Denies blurred vision, double vision, ear pain, eye pain, hearing loss, nose bleeds, sore throat Cardiac:  No dizziness, chest pain or heaviness, chest tightness,edema, No JVD Resp:   No cough, -sputum production, -shortness of breath,-wheezing, -hemoptysis,  Gi: Denies swallowing difficulty, stomach pain, nausea or vomiting, diarrhea, constipation, bowel incontinence Gu:  Denies bladder incontinence, burning urine Ext:   Denies Joint pain,  stiffness or swelling Skin: Denies  skin rash, easy bruising or bleeding or hives Endoc:  Denies polyuria, polydipsia , polyphagia or weight change Psych:   Denies depression, insomnia or hallucinations  Other:  All other systems negative  VITAL SIGNS: BP 130/84 (BP Location: Right Arm, Patient Position: Sitting, Cuff Size: Normal)   Pulse 73   Temp 97.6 F (36.4 C) (Temporal)   Ht 5\' 5"  (1.651 m)   Wt 236 lb 3.2 oz (107.1 kg)   SpO2 98%   BMI 39.31 kg/m    Physical Examination:   General Appearance: No distress  EYES PERRLA, EOM intact.   NECK Supple, No  JVD Pulmonary: normal breath sounds, No wheezing.  CardiovascularNormal S1,S2.  No m/r/g.   Abdomen: Benign, Soft, non-tender. Skin:   warm, no rashes, no ecchymosis  Extremities: normal, no cyanosis, clubbing. Neuro:without focal findings,  speech normal  PSYCHIATRIC: Mood, affect within normal limits.   ASSESSMENT AND PLAN  OSA Reviewed HST results with patient. Starting patient on APAP therapy set to 4-16 cm H2O. Discussed the consequences of untreated sleep apnea. Advised not to drive drowsy for safety of patient and others. Will follow up in 3 months to review CPAP efficacy and compliance data.   Obesity Counseled patient on diet and lifestyle modification.    Patient  satisfied with Plan of action and management. All questions answered  I spent a total of 33 minutes reviewing chart data, face-to-face evaluation with the patient, counseling and coordination of care as detailed above.    Kenlei Safi, M.D.  Sleep Medicine [facility]

## 2023-09-28 DIAGNOSIS — G4733 Obstructive sleep apnea (adult) (pediatric): Secondary | ICD-10-CM | POA: Diagnosis not present

## 2023-10-16 ENCOUNTER — Encounter: Payer: Self-pay | Admitting: Obstetrics

## 2023-10-16 ENCOUNTER — Ambulatory Visit (INDEPENDENT_AMBULATORY_CARE_PROVIDER_SITE_OTHER): Payer: Medicaid Other | Admitting: Obstetrics

## 2023-10-16 VITALS — BP 157/91 | HR 77 | Ht 65.0 in | Wt 231.0 lb

## 2023-10-16 DIAGNOSIS — Z01419 Encounter for gynecological examination (general) (routine) without abnormal findings: Secondary | ICD-10-CM | POA: Diagnosis not present

## 2023-10-16 NOTE — Progress Notes (Signed)
 ANNUAL GYNECOLOGICAL EXAM  SUBJECTIVE  HPI  Rebekah Parks is a 33 y.o.-year-old G3P0 who presents for an annual gynecological exam today.  She denies pelvic pain, dyspareunia, abnormal vaginal bleeding or discharge, and UTI symptoms. She has concerns about pain in her breasts that occurs right before her period. She has also noticed lumps in her breast that last for a few months and then resolve. She has not had a Pap in 6-7 years but does not want one today.  Medical/Surgical History Past Medical History:  Diagnosis Date   Blood type, Rh negative 02/15/2016   Chronic back pain    HNP,spondylosis/DDD/radiculopathy   Family history of coarctation of aorta 12/28/2015   GBS (group B Streptococcus carrier), +RV culture, currently pregnant 06/21/2016   Past Surgical History:  Procedure Laterality Date   WISDOM TOOTH EXTRACTION      Social History Lives with husband and 3 children. Feels safe there Work: SAHM/homeschools Exercise: walking dog 15-30 min daily Substances: Denies EtOH, tobacco, and recreational drugs. +vape  Obstetric History OB History     Gravida  3   Para      Term      Preterm      AB      Living  3      SAB      IAB      Ectopic      Multiple      Live Births  3            GYN/Menstrual History No LMP recorded (lmp unknown). Regular monthly periods Last Pap: 6-7 years ago Contraception: NFP  Prevention Endorses regular dental and eye exams Mammogram: at 39 Colonoscopy: at 24  Current Medications Outpatient Medications Prior to Visit  Medication Sig   ACETYLCYSTEINE PO Take by mouth.  Otc supplement   buPROPion  (WELLBUTRIN  XL) 300 MG 24 hr tablet Take 1 tablet (300 mg total) by mouth daily.   diclofenac  (VOLTAREN ) 50 MG EC tablet Take 1 tablet (50 mg total) by mouth 2 (two) times daily as needed.   Menaquinone-7 (VITAMIN K2 PO) Take 1 tablet by mouth daily.   metFORMIN  (GLUCOPHAGE -XR) 500 MG 24 hr tablet Take 1 tablet (500  mg total) by mouth daily with breakfast. Increase by 1 tablet (500 mg) weekly as tolerated to a maximum of 4 tablets (2000 mg total) daily.   OLIVE LEAF EXTRACT PO Take 500 mg by mouth daily.   Psyllium Husk POWD by Does not apply route.   SUBOXONE 8-2 MG FILM Place under the tongue 2 (two) times daily.   Thiamine HCl (VITAMIN B1 PO) Take 1 tablet by mouth daily.   UNABLE TO FIND Take 1 tablet by mouth daily. Med Name: beet supplement   No facility-administered medications prior to visit.      [facility] - 10/16/23 1424       Pregnancy Intention Screening   Does the patient want to become pregnant in the next year? No    Does the patient's partner want to become pregnant in the next year? No    Would the patient like to discuss contraceptive options today? No      Contraception Wrap Up   Contraception Counseling Provided No    How was the end contraceptive method provided? N/A              ROS Constitutional: Denied constitutional symptoms, night sweats, recent illness, fatigue, fever, insomnia and weight loss. Occasionally forgets words.  Eyes: Denied eye symptoms,  eye pain, photophobia, vision change and visual disturbance.  Ears/Nose/Throat/Neck: Denied ear, nose, throat or neck symptoms, hearing loss, nasal discharge, sinus congestion and sore throat.  Cardiovascular: Denied cardiovascular symptoms, arrhythmia, chest pain/pressure, edema, exercise intolerance, orthopnea and palpitations.  Respiratory: Denied pulmonary symptoms, asthma, pleuritic pain, productive sputum, cough, dyspnea and wheezing.  Gastrointestinal: Denied gastro-esophageal reflux, melena, nausea and vomiting.  Genitourinary: Denied genitourinary symptoms including symptomatic vaginal discharge, pelvic relaxation issues, and urinary complaints.  Musculoskeletal: Denied musculoskeletal symptoms, stiffness, swelling, muscle weakness and myalgia.  Dermatologic: Denied dermatology symptoms, rash and scar.   Neurologic: Denied neurology symptoms, dizziness, headache, neck pain and syncope.  Psychiatric: Denied psychiatric symptoms, anxiety and depression.  Endocrine: Denied endocrine symptoms including hot flashes and night sweats.    OBJECTIVE  BP (!) 157/91   Pulse 77   Ht 5\' 5"  (1.651 m)   Wt 231 lb (104.8 kg)   LMP  (LMP Unknown)   BMI 38.44 kg/m   Repeat BP: 138/91  Physical examination General NAD, Conversant  HEENT Atraumatic; Op clear with mmm.  Normo-cephalic. Pupils reactive. Anicteric sclerae  Thyroid/Neck Smooth without nodularity or enlargement. Normal ROM.  Neck Supple.  Skin No rashes, lesions or ulceration. Normal palpated skin turgor. No nodularity.  Breasts: No masses or discharge.  Symmetric.  No axillary adenopathy.  Lungs: Clear to auscultation.No rales or wheezes. Normal Respiratory effort, no retractions.  Heart: NSR.  No murmurs or rubs appreciated. No peripheral edema  Abdomen: Soft.  Non-tender.  No masses.  No HSM. No hernia  Extremities: Moves all appropriately.  Normal ROM for age. No lymphadenopathy.  Neuro: Oriented to PPT.  Normal mood. Normal affect.     Pelvic:  Declined    ASSESSMENT  1) Annual exam 2) Breast pain  PLAN 1) Physical exam as noted. Discussed healthy lifestyle choices and preventive care. Declines STI testing. Routine labs done with PCP. Encouraged to f/u on BP with PCP. Recommend Pap smear.  2) Reviewed physiologic breast changes. Encouraged to return if a mass persists.  Return in one year for annual exam or as needed for concerns.   Dona Klemann, CNM

## 2023-10-31 ENCOUNTER — Ambulatory Visit: Payer: Medicaid Other | Admitting: Family Medicine

## 2023-11-03 ENCOUNTER — Other Ambulatory Visit: Payer: Self-pay | Admitting: Family Medicine

## 2023-11-03 DIAGNOSIS — Z7689 Persons encountering health services in other specified circumstances: Secondary | ICD-10-CM

## 2023-11-04 NOTE — Telephone Encounter (Signed)
 Requested Prescriptions  Pending Prescriptions Disp Refills   metFORMIN  (GLUCOPHAGE -XR) 500 MG 24 hr tablet [Pharmacy Med Name: METFORMIN  HCL ER 500 MG TAB] 120 tablet 2    Sig: TAKE 1 TABLET BY MOUTH DAILY AT BREAKFAST. INCREASE BY 1 TABLET EACH WEEK AS TOLERATED TO A MAX OF 4 TABS DAILY     Endocrinology:  Diabetes - Biguanides Failed - 11/04/2023  1:46 PM      Failed - B12 Level in normal range and within 720 days    No results found for: "VITAMINB12"       Failed - CBC within normal limits and completed in the last 12 months    WBC  Date Value Ref Range Status  07/24/2023 6.1 3.4 - 10.8 x10E3/uL Final  03/12/2015 8.5 3.6 - 11.0 K/uL Final   RBC  Date Value Ref Range Status  07/24/2023 4.86 3.77 - 5.28 x10E6/uL Final  03/12/2015 4.74 3.80 - 5.20 MIL/uL Final   Hemoglobin  Date Value Ref Range Status  07/24/2023 13.8 11.1 - 15.9 g/dL Final   Hematocrit  Date Value Ref Range Status  07/24/2023 41.5 34.0 - 46.6 % Final   MCHC  Date Value Ref Range Status  07/24/2023 33.3 31.5 - 35.7 g/dL Final  57/84/6962 95.2 32.0 - 36.0 g/dL Final   [facility]  Date Value Ref Range Status  07/24/2023 28.4 26.6 - 33.0 pg Final  03/12/2015 28.9 26.0 - 34.0 pg Final   MCV  Date Value Ref Range Status  07/24/2023 85 79 - 97 fL Final   No results found for: "PLTCOUNTKUC", "LABPLAT", "POCPLA" RDW  Date Value Ref Range Status  07/24/2023 12.7 11.7 - 15.4 % Final         Passed - Cr in normal range and within 360 days    Creatinine, Ser  Date Value Ref Range Status  07/24/2023 0.74 0.57 - 1.00 mg/dL Final         Passed - HBA1C is between 0 and 7.9 and within 180 days    Hgb A1c MFr Bld  Date Value Ref Range Status  07/24/2023 5.3 4.8 - 5.6 % Final    Comment:             Prediabetes: 5.7 - 6.4          Diabetes: >6.4          Glycemic control for adults with diabetes: <7.0          Passed - eGFR in normal range and within 360 days    GFR calc Af Amer  Date Value Ref Range  Status  03/12/2015 >60 >60 mL/min Final    Comment:    (NOTE) The eGFR has been calculated using the CKD EPI equation. This calculation has not been validated in all clinical situations. eGFR's persistently <60 mL/min signify possible Chronic Kidney Disease.    GFR calc non Af Amer  Date Value Ref Range Status  03/12/2015 >60 >60 mL/min Final   eGFR  Date Value Ref Range Status  07/24/2023 109 >59 mL/min/1.73 Final         Passed - Valid encounter within last 6 months    Recent Outpatient Visits           3 months ago Encounter for weight management   [place] [facility] at [facility], Dessie Flow, MD   3 months ago Healthcare maintenance   [facility] Primary Care & Sports Medicine at [facility],  Dessie Flow, MD       Future Appointments             In 1 week Augustus Ledger, Dessie Flow, MD [facility] Primary Care & Sports Medicine at [facility]

## 2023-11-06 ENCOUNTER — Ambulatory Visit: Admitting: Family Medicine

## 2023-11-17 ENCOUNTER — Ambulatory Visit: Admitting: Family Medicine

## 2023-12-05 ENCOUNTER — Ambulatory Visit (INDEPENDENT_AMBULATORY_CARE_PROVIDER_SITE_OTHER): Admitting: Family Medicine

## 2023-12-05 ENCOUNTER — Encounter: Payer: Self-pay | Admitting: Family Medicine

## 2023-12-05 VITALS — BP 124/80 | HR 87 | Ht 65.0 in | Wt 222.6 lb

## 2023-12-05 DIAGNOSIS — G4733 Obstructive sleep apnea (adult) (pediatric): Secondary | ICD-10-CM

## 2023-12-05 DIAGNOSIS — E66812 Obesity, class 2: Secondary | ICD-10-CM

## 2023-12-05 DIAGNOSIS — R030 Elevated blood-pressure reading, without diagnosis of hypertension: Secondary | ICD-10-CM

## 2023-12-05 DIAGNOSIS — Z7689 Persons encountering health services in other specified circumstances: Secondary | ICD-10-CM

## 2023-12-05 DIAGNOSIS — E7800 Pure hypercholesterolemia, unspecified: Secondary | ICD-10-CM | POA: Diagnosis not present

## 2023-12-05 MED ORDER — TOPIRAMATE 25 MG PO CPSP
25.0000 mg | ORAL_CAPSULE | Freq: Two times a day (BID) | ORAL | 0 refills | Status: DC
Start: 2023-12-05 — End: 2024-03-07

## 2023-12-05 MED ORDER — BUPROPION HCL ER (XL) 300 MG PO TB24: 300.0000 mg | ORAL_TABLET | Freq: Every day | ORAL | 0 refills | Status: DC

## 2023-12-05 MED ORDER — METFORMIN HCL ER (OSM) 1000 MG PO TB24
1000.0000 mg | ORAL_TABLET | Freq: Every day | ORAL | 0 refills | Status: DC
Start: 2023-12-05 — End: 2023-12-28

## 2023-12-05 NOTE — Assessment & Plan Note (Signed)
 Hypertension Blood pressure well-controlled, weight loss and stress management beneficial. - Encourage stress management techniques. - Reassess blood pressure at future visits.

## 2023-12-05 NOTE — Patient Instructions (Signed)
 1. Blood Pressure Management:    - Practice stress management techniques.    - Blood pressure will be reassessed at future visits.  2. Cholesterol Management:    - Continue dietary modifications to manage cholesterol.    - Monitor cholesterol levels in future lab tests.    - Review educational materials on healthy fats and cholesterol management.  3. Weight Management:    - Continue taking Wellbutrin  300 mg daily.    - Start taking topiramate 1 capsule (5 mg) daily.    - Maintain lifestyle modifications, including diet and exercise.    - Monitor weight and adjust medications as needed.  4. Sleep Apnea Management:    - Continue using the CPAP machine for sleep apnea.    - Consider adjusting the CPAP mask for comfort; discuss with your sleep medicine doctor if needed.    - Use CPAP consistently for improved sleep quality.

## 2023-12-05 NOTE — Assessment & Plan Note (Signed)
 Hyperlipidemia Cholesterol slightly elevated, dietary changes in progress, favorable cholesterol ratio. - Continue dietary modifications to manage cholesterol. - Monitor cholesterol levels in future labs. - Provide educational materials on healthy fats and cholesterol management.

## 2023-12-05 NOTE — Progress Notes (Signed)
 Primary Care / Sports Medicine Office Visit  Patient Information:  Patient ID: Rebekah Parks, female DOB: 01-02-1991 Age: 33 y.o. MRN: [id number]   Rebekah Parks is a pleasant 33 y.o. female presenting with the following:  Chief Complaint  Patient presents with   Weight Check    Patient presents today for a weight check. She has been taking Wellbutrin  and metformin  to help aid in weight loss.    Vitals:   12/05/23 1431  BP: 124/80  Pulse: 87  SpO2: 100%   Vitals:   12/05/23 1431  Weight: 222 lb 9.6 oz (101 kg)  Height: 5' 5 (1.651 m)   Body mass index is 37.04 kg/m.  No results found.   Independent interpretation of notes and tests performed by another provider:   None  Procedures performed:   None  Pertinent History, Exam, Impression, and Recommendations:   Problem List Items Addressed This Visit     Elevated blood pressure reading without diagnosis of hypertension   Hypertension Blood pressure well-controlled, weight loss and stress management beneficial. - Encourage stress management techniques. - Reassess blood pressure at future visits.      Encounter for weight management   Relevant Medications   buPROPion  (WELLBUTRIN  XL) 300 MG 24 hr tablet   topiramate (TOPAMAX) 25 MG capsule   metformin  (FORTAMET ) 1000 MG (OSM) 24 hr tablet   Hyperlipidemia   Hyperlipidemia Cholesterol slightly elevated, dietary changes in progress, favorable cholesterol ratio. - Continue dietary modifications to manage cholesterol. - Monitor cholesterol levels in future labs. - Provide educational materials on healthy fats and cholesterol management.      Relevant Medications   aspirin EC 81 MG tablet   Obesity, class 2 - Primary   Weight loss and appetite changes - Weight decreased by approximately 10 pounds since May 12th, attributed to current medication regimen - Total weight loss of about 35 pounds since beginning weight loss efforts, with weight decreasing from  nearly 260 pounds a year and a half ago to 222 pounds currently - Steady weight loss since February - History of weight fluctuations, with similar weight and nausea after her son's birth in 2016 - Reduced appetite due to significant nausea, which is reminiscent of pregnancy-related nausea and occurs if she eats 'one bite too much' - Satisfaction with current weight loss progress  Obesity Weight loss achieved with metformin , Wellbutrin , and lifestyle changes. Considering topiramate for further weight management. - Continue Wellbutrin  300 mg daily. - Initiate topiramate 1 capsule (5 mg) daily. - Encourage lifestyle modifications including diet and exercise. - Monitor weight and adjust medications as needed.      Relevant Medications   buPROPion  (WELLBUTRIN  XL) 300 MG 24 hr tablet   topiramate (TOPAMAX) 25 MG capsule   metformin  (FORTAMET ) 1000 MG (OSM) 24 hr tablet   OSA on CPAP   Sleep apnea and cpap use - Mild sleep apnea managed with CPAP machine - Working on improving sleep habits  Mild sleep apnea Managed with CPAP therapy, occasional discomfort reported. - Continue CPAP therapy. - Consider adjusting CPAP mask for comfort - can discuss with your sleep medicine doctor. - Encourage consistent use of CPAP for improved sleep quality.        Orders & Medications Medications:  Meds ordered this encounter  Medications   buPROPion  (WELLBUTRIN  XL) 300 MG 24 hr tablet    Sig: Take 1 tablet (300 mg total) by mouth daily.    Dispense:  90 tablet    Refill:  0   topiramate (TOPAMAX) 25 MG capsule    Sig: Take 1 capsule (25 mg total) by mouth 2 (two) times daily.    Dispense:  180 capsule    Refill:  0   metformin  (FORTAMET ) 1000 MG (OSM) 24 hr tablet    Sig: Take 1 tablet (1,000 mg total) by mouth daily with breakfast.    Dispense:  90 tablet    Refill:  0   No orders of the defined types were placed in this encounter.    Return in about 3 months (around 03/06/2024) for  Weight MGMT f/u.     Selinda JINNY Ku, MD, [facility]   Primary Care Sports Medicine Primary Care and Sports Medicine at [facility]

## 2023-12-05 NOTE — Assessment & Plan Note (Signed)
 Weight loss and appetite changes - Weight decreased by approximately 10 pounds since May 12th, attributed to current medication regimen - Total weight loss of about 35 pounds since beginning weight loss efforts, with weight decreasing from nearly 260 pounds a year and a half ago to 222 pounds currently - Steady weight loss since February - History of weight fluctuations, with similar weight and nausea after her son's birth in 2016 - Reduced appetite due to significant nausea, which is reminiscent of pregnancy-related nausea and occurs if she eats 'one bite too much' - Satisfaction with current weight loss progress  Obesity Weight loss achieved with metformin , Wellbutrin , and lifestyle changes. Considering topiramate for further weight management. - Continue Wellbutrin  300 mg daily. - Initiate topiramate 1 capsule (5 mg) daily. - Encourage lifestyle modifications including diet and exercise. - Monitor weight and adjust medications as needed.

## 2023-12-05 NOTE — Assessment & Plan Note (Signed)
 Sleep apnea and cpap use - Mild sleep apnea managed with CPAP machine - Working on improving sleep habits  Mild sleep apnea Managed with CPAP therapy, occasional discomfort reported. - Continue CPAP therapy. - Consider adjusting CPAP mask for comfort - can discuss with your sleep medicine doctor. - Encourage consistent use of CPAP for improved sleep quality.

## 2023-12-13 ENCOUNTER — Other Ambulatory Visit (HOSPITAL_COMMUNITY): Payer: Self-pay

## 2023-12-28 ENCOUNTER — Ambulatory Visit: Admitting: Sleep Medicine

## 2023-12-28 ENCOUNTER — Encounter: Payer: Self-pay | Admitting: Sleep Medicine

## 2023-12-28 VITALS — BP 100/60 | HR 88 | Temp 99.1°F | Ht 65.0 in | Wt 216.2 lb

## 2023-12-28 DIAGNOSIS — E669 Obesity, unspecified: Secondary | ICD-10-CM | POA: Diagnosis not present

## 2023-12-28 DIAGNOSIS — Z6835 Body mass index (BMI) 35.0-35.9, adult: Secondary | ICD-10-CM

## 2023-12-28 DIAGNOSIS — G4733 Obstructive sleep apnea (adult) (pediatric): Secondary | ICD-10-CM | POA: Diagnosis not present

## 2023-12-28 DIAGNOSIS — Z87891 Personal history of nicotine dependence: Secondary | ICD-10-CM

## 2023-12-28 DIAGNOSIS — E66812 Obesity, class 2: Secondary | ICD-10-CM

## 2023-12-28 NOTE — Patient Instructions (Signed)

## 2023-12-28 NOTE — Progress Notes (Signed)
 Name:Rebekah Parks MRN: [id number] DOB: [birth year]   CHIEF COMPLAINT:  CPAP F/U   HISTORY OF PRESENT ILLNESS:  Rebekah Parks is a 33 y.o. w/ a h/o OSA and obesity who presents for CPAP F/U visit. Reports using CPAP therapy every night, which is confirmed by compliance data. Reports feeling more refreshed upon awakening with CPAP therapy. She is currently using the Airfit N30i nasal mask, which causes some discomfort.    EPWORTH SLEEP SCORE 10    08/01/2023    2:00 PM  Results of the Epworth flowsheet  Sitting and reading 3  Watching TV 2  Sitting, inactive in a public place (e.g. a theatre or a meeting) 0  As a passenger in a car for an hour without a break 1  Lying down to rest in the afternoon when circumstances permit 3  Sitting and talking to someone 0  Sitting quietly after a lunch without alcohol 1  In a car, while stopped for a few minutes in traffic 0  Total score 10    PAST MEDICAL HISTORY :   has a past medical history of Blood type, Rh negative (02/15/2016), Chronic back pain, Family history of coarctation of aorta (12/28/2015), and GBS (group B Streptococcus carrier), +RV culture, currently pregnant (06/21/2016).  has a past surgical history that includes Wisdom tooth extraction. Prior to Admission medications   Medication Sig Start Date End Date Taking? Authorizing Provider  metFORMIN  (GLUCOPHAGE -XR) 500 MG 24 hr tablet Take 500 mg by mouth 2 (two) times daily. 12/06/23  Yes [provider]  ACETYLCYSTEINE PO Take by mouth.  Otc supplement    [provider]  aspirin EC 81 MG tablet Take 81 mg by mouth daily. Swallow whole.    [provider]  buPROPion  (WELLBUTRIN  XL) 300 MG 24 hr tablet Take 1 tablet (300 mg total) by mouth daily. 12/05/23   Alvia Selinda PARAS, MD  diclofenac  (VOLTAREN ) 50 MG EC tablet Take 1 tablet (50 mg total) by mouth 2 (two) times daily as needed. 07/24/23   Matthews, Jason J, MD  Menaquinone-7 (VITAMIN K2 PO)  Take 1 tablet by mouth daily.    [provider]  OLIVE LEAF EXTRACT PO Take 500 mg by mouth daily.    [provider]  Psyllium Husk POWD by Does not apply route.    [provider]  SUBOXONE 8-2 MG FILM Place under the tongue 2 (two) times daily. 09/01/21   [provider]  Thiamine HCl (VITAMIN B1 PO) Take 1 tablet by mouth daily.    [provider]  topiramate  (TOPAMAX ) 25 MG capsule Take 1 capsule (25 mg total) by mouth 2 (two) times daily. 12/05/23   Matthews, Jason J, MD  UNABLE TO FIND Take 1 tablet by mouth daily. Med Name: beet supplement    [provider]   No Known Allergies  FAMILY HISTORY:  family history includes Breast cancer in her maternal grandmother; Healthy in her father and mother. SOCIAL HISTORY:  reports that she has quit smoking. Her smoking use included cigarettes. She has a 5 pack-year smoking history. She has never used smokeless tobacco. She reports that she does not currently use drugs after having used the following drugs: Marijuana. She reports that she does not drink alcohol.   Review of Systems:  Gen:  Denies  fever, sweats, chills weight loss  HEENT: Denies blurred vision, double vision, ear pain, eye pain, hearing loss, nose bleeds, sore throat Cardiac:  No dizziness, chest pain or heaviness, chest tightness,edema, No JVD Resp:   No cough, -sputum production, -shortness of breath,-wheezing, -hemoptysis,  Gi: Denies swallowing difficulty, stomach pain, nausea or vomiting, diarrhea, constipation, bowel incontinence Gu:  Denies bladder incontinence, burning urine Ext:   Denies Joint pain, stiffness or swelling Skin: Denies  skin rash, easy bruising or bleeding or hives Endoc:  Denies polyuria, polydipsia , polyphagia or weight change Psych:   Denies depression, insomnia or hallucinations  Other:  All other systems negative  VITAL SIGNS: BP 100/60 (BP Location: Left Arm, Patient Position: Sitting, Cuff  Size: Normal)   Pulse 88   Temp 99.1 F (37.3 C) (Oral)   Ht 5' 5 (1.651 m)   Wt 216 lb 3.2 oz (98.1 kg)   SpO2 98%   BMI 35.98 kg/m     Physical Examination:   General Appearance: No distress  EYES PERRLA, EOM intact.   NECK Supple, No JVD Pulmonary: normal breath sounds, No wheezing.  CardiovascularNormal S1,S2.  No m/r/g.   Abdomen: Benign, Soft, non-tender. Skin:   warm, no rashes, no ecchymosis  Extremities: normal, no cyanosis, clubbing. Neuro:without focal findings,  speech normal  PSYCHIATRIC: Mood, affect within normal limits.   ASSESSMENT AND PLAN  OSA Patient is using and benefiting from CPAP therapy. For mask discomfort, will try patient on the Airfit F30i FFM. Counseled patient on increasing CPAP compliance. Discussed the consequences of untreated sleep apnea. Advised not to drive drowsy for safety of patient and others. Will follow up in 3 months.    Obesity Counseled patient on diet and lifestyle modification.    Patient  satisfied with Plan of action and management. All questions answered  I spent a total of 23 minutes reviewing chart data, face-to-face evaluation with the patient, counseling and coordination of care as detailed above.    Dann Ventress, M.D.  Sleep Medicine [facility]

## 2024-01-09 IMAGING — CR DG LUMBAR SPINE COMPLETE 4+V
6 series · 6 of 6 positions shown · non-contrast
Comparison: 12/09/2011

CLINICAL DATA: History of L5-S1 intervertebral disc disorder,
radicular symptomatology, bilateral foot pain

EXAM:
LUMBAR SPINE - COMPLETE 4+ VIEW

[l-spine ap]
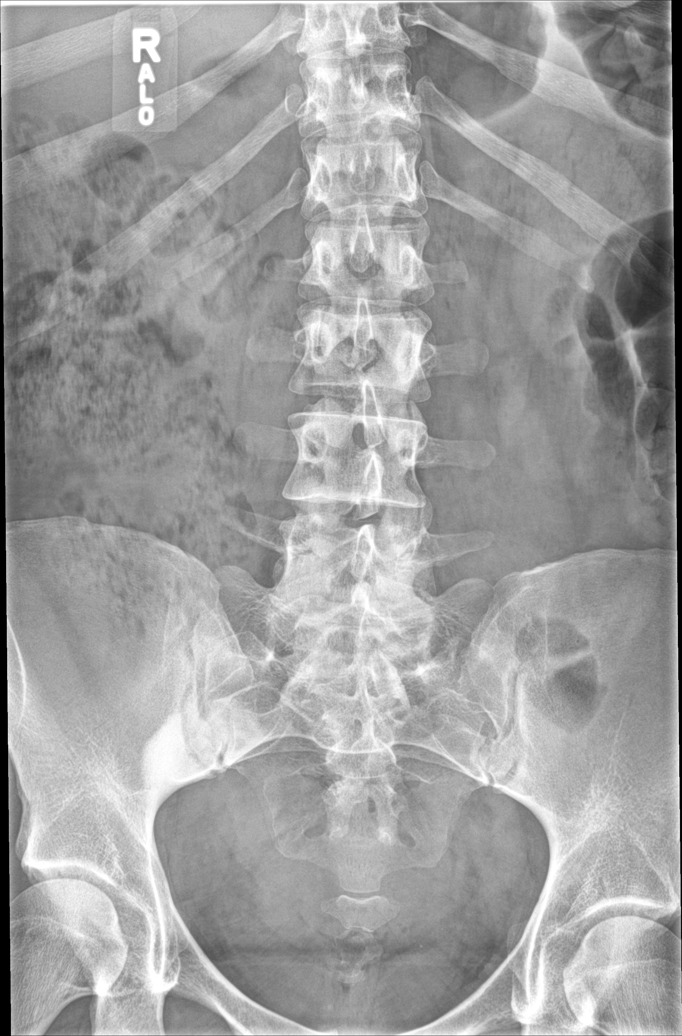

[l-spine obl (1 of 2)]
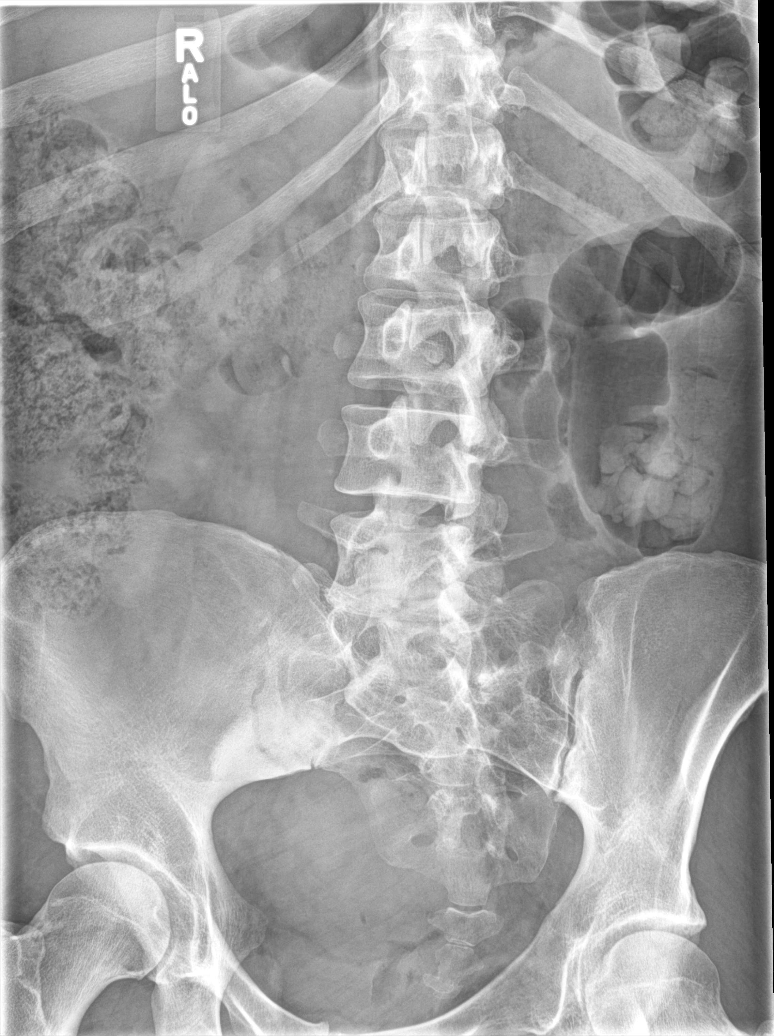

[l-spine obl (2 of 2)]
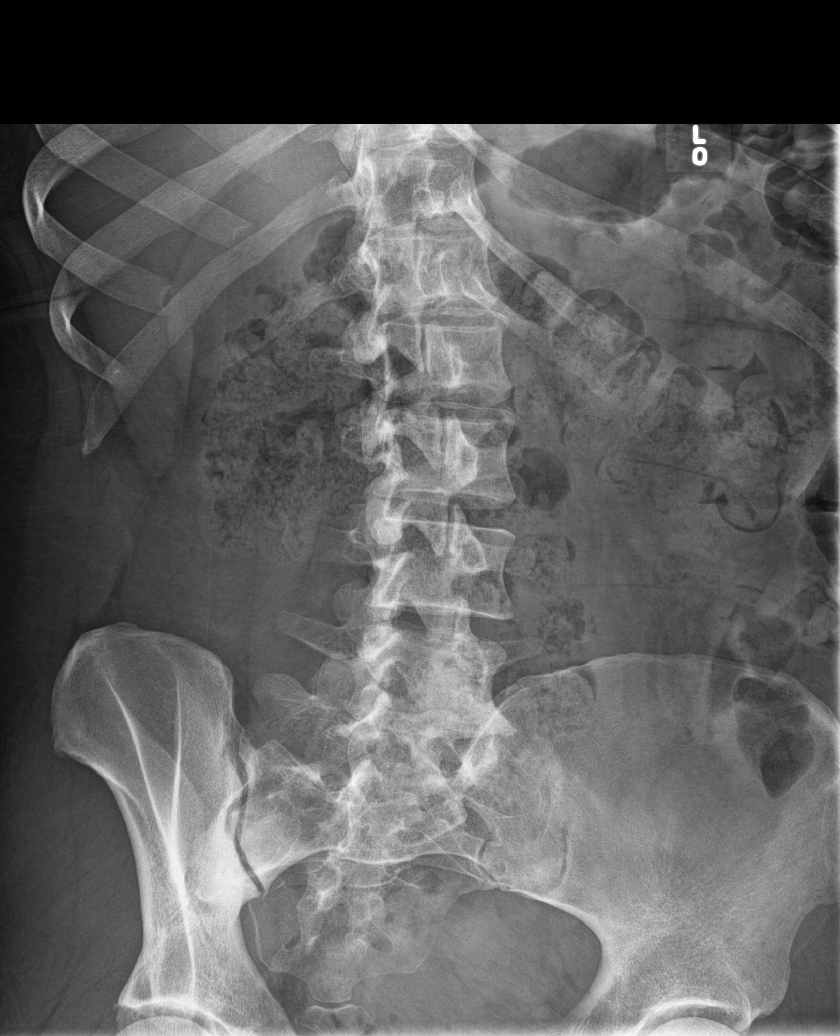

[l-spine lat (1 of 2)]
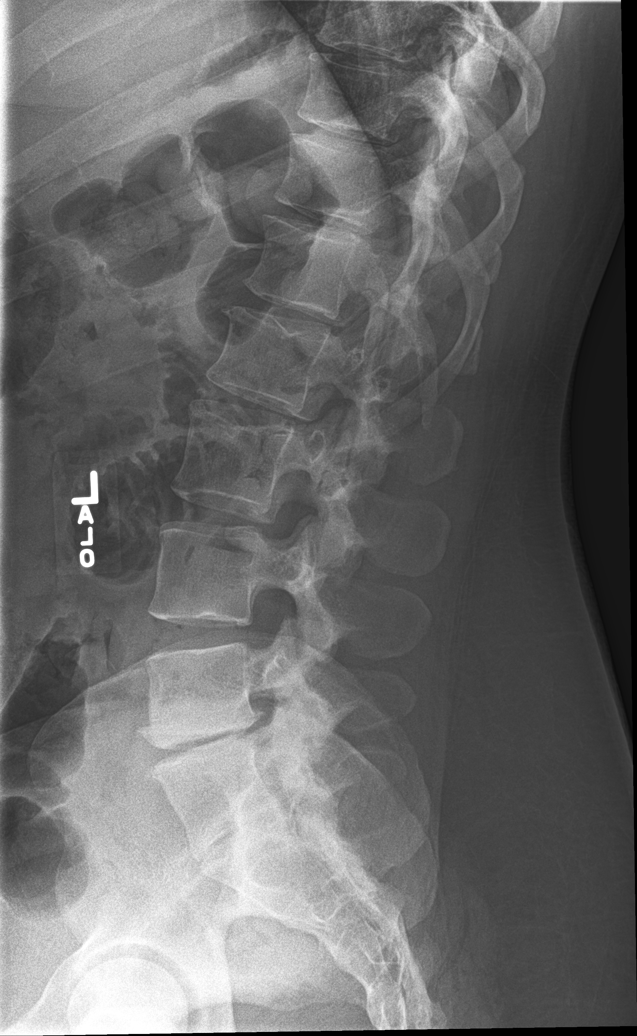

[l-spine spot]
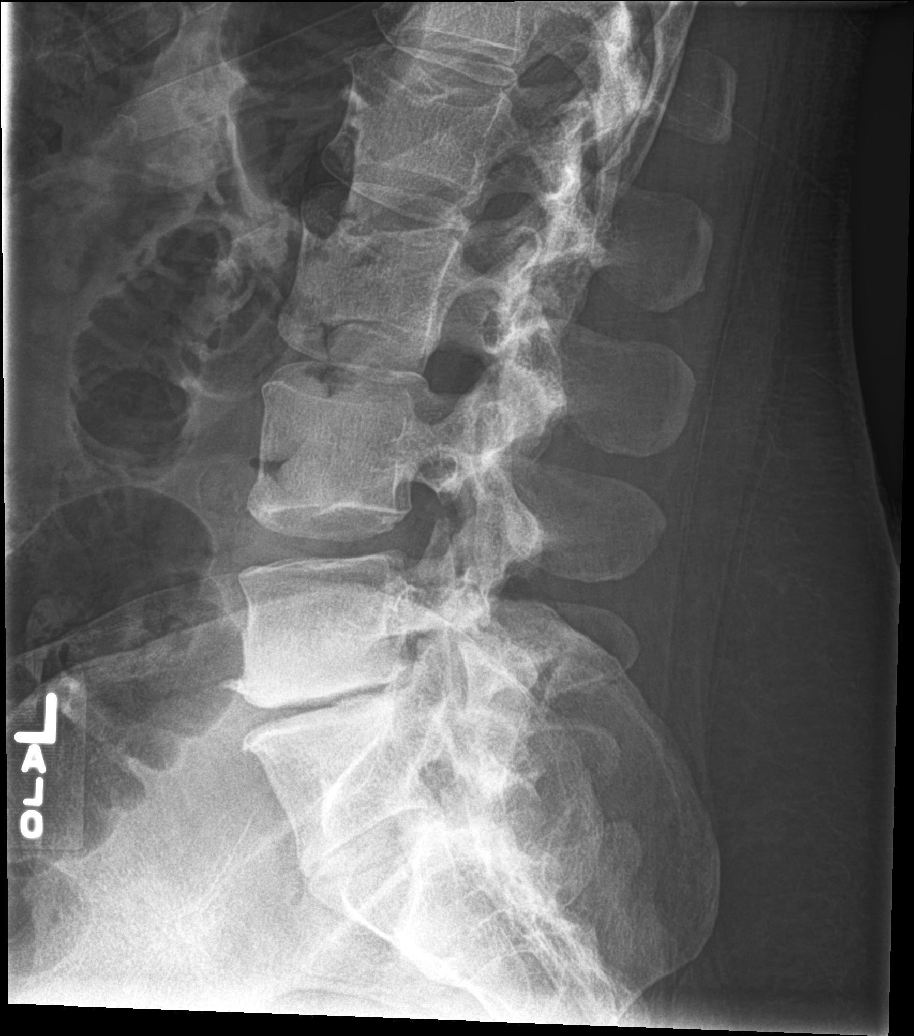

[l-spine lat (2 of 2)]
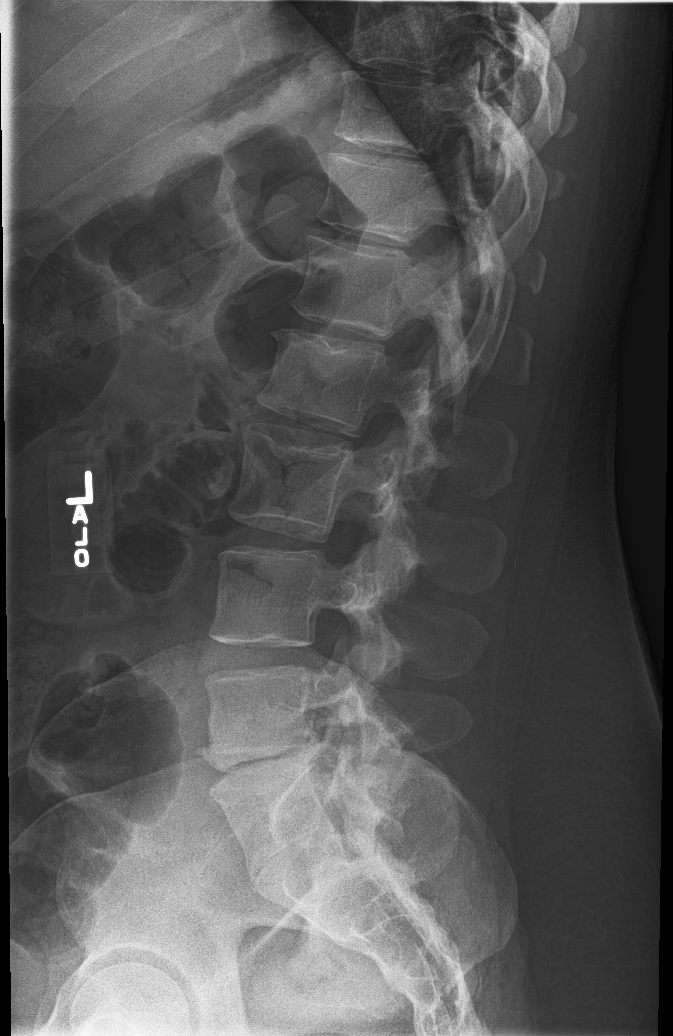

[6 of 6 positions shown; findings below may reference images not displayed]

FINDINGS: Frontal, bilateral oblique, lateral views of the lumbar spine are
obtained. There are 5 non-rib-bearing lumbar type vertebral bodies
identified, with bilateral sacralization at the L5 level again
noted. There is mild left convex curvature centered at the
thoracolumbar junction. Otherwise alignment is anatomic. No acute
fracture. Mild spondylosis unchanged at T12/L1 and L1/L2. Asymmetric
sclerosis of the sacroiliac joints, right greater than left,
consistent with sacroiliitis. No erosive changes.
IMPRESSION: 1. Lumbar segmentation anomaly with bilateral sacralization of the
L5 level.
2. Progressive marked spondylosis and facet hypertrophy at L4-5 and
L5-S1.
3. Interval development of asymmetric sclerosis of the sacroiliac
joints, right greater than left, compatible with sacroiliitis.
4. Minimal left convex scoliosis.

## 2024-01-24 ENCOUNTER — Other Ambulatory Visit: Payer: Self-pay | Admitting: Family Medicine

## 2024-01-24 DIAGNOSIS — E66812 Obesity, class 2: Secondary | ICD-10-CM

## 2024-01-24 DIAGNOSIS — Z7689 Persons encountering health services in other specified circumstances: Secondary | ICD-10-CM

## 2024-01-25 NOTE — Telephone Encounter (Signed)
 Requested Prescriptions  Refused Prescriptions Disp Refills   topiramate  (TOPAMAX ) 25 MG capsule [Pharmacy Med Name: TOPIRAMATE  25 MG CAP] 180 capsule 0    Sig: TAKE 1 CAPSULE BY MOUTH TWICE DAILY     Neurology: Anticonvulsants - topiramate  & zonisamide Failed - 01/25/2024  2:37 PM      Failed - Completed PHQ-2 or PHQ-9 in the last 360 days      Passed - Cr in normal range and within 360 days    Creatinine, Ser  Date Value Ref Range Status  07/24/2023 0.74 0.57 - 1.00 mg/dL Final         Passed - CO2 in normal range and within 360 days    CO2  Date Value Ref Range Status  07/24/2023 25 20 - 29 mmol/L Final         Passed - ALT in normal range and within 360 days    ALT  Date Value Ref Range Status  07/24/2023 10 0 - 32 IU/L Final         Passed - AST in normal range and within 360 days    AST  Date Value Ref Range Status  07/24/2023 12 0 - 40 IU/L Final         Passed - Valid encounter within last 12 months    Recent Outpatient Visits           1 month ago Obesity, class 2   [place] [facility], Selinda PARAS, MD   5 months ago Encounter for weight management   [place] [facility], Selinda PARAS, MD   6 months ago Healthcare maintenance   [facility] at [facility], Selinda PARAS, MD       Future Appointments             In 1 month Ku, Selinda PARAS, MD [facility] Primary Care & Sports Medicine at [facility], [facility]

## 2024-02-10 ENCOUNTER — Encounter: Payer: Self-pay | Admitting: Family Medicine

## 2024-02-12 NOTE — Telephone Encounter (Signed)
 Please review and advise.  JM

## 2024-03-07 ENCOUNTER — Encounter: Payer: Self-pay | Admitting: Family Medicine

## 2024-03-07 ENCOUNTER — Ambulatory Visit (INDEPENDENT_AMBULATORY_CARE_PROVIDER_SITE_OTHER): Admitting: Family Medicine

## 2024-03-07 VITALS — BP 116/82 | HR 71 | Ht 65.0 in | Wt 209.0 lb

## 2024-03-07 DIAGNOSIS — Z7689 Persons encountering health services in other specified circumstances: Secondary | ICD-10-CM

## 2024-03-07 DIAGNOSIS — E66812 Obesity, class 2: Secondary | ICD-10-CM

## 2024-03-07 MED ORDER — METFORMIN HCL ER 500 MG PO TB24: 500.0000 mg | ORAL_TABLET | Freq: Two times a day (BID) | ORAL | 0 refills | Status: AC

## 2024-03-07 MED ORDER — BUPROPION HCL ER (XL) 300 MG PO TB24
300.0000 mg | ORAL_TABLET | Freq: Every day | ORAL | 0 refills | Status: AC
Start: 2024-03-07 — End: ?

## 2024-03-07 MED ORDER — TOPIRAMATE 25 MG PO CPSP: 25.0000 mg | ORAL_CAPSULE | Freq: Two times a day (BID) | ORAL | 0 refills | Status: AC

## 2024-03-07 NOTE — Progress Notes (Signed)
 Primary Care / Sports Medicine Office Visit  Patient Information:  Patient ID: Rebekah Parks, female DOB: [birth year] Age: 33 y.o. MRN: [id number]   Rebekah Parks is a pleasant 33 y.o. female presenting with the following:  Chief Complaint  Patient presents with   Weight Management Screening    Patient presents today for a weight check. She is taking metformin  and topamax  for weight loss purposes. She has been doing well and lost 7 pounds since her last visit.     Vitals:   03/07/24 1530  BP: 116/82  Pulse: 71  SpO2: 99%   Vitals:   03/07/24 1530  Weight: 209 lb (94.8 kg)  Height: 5' 5 (1.651 m)   Body mass index is 34.78 kg/m.  No results found.   Discussed the use of AI scribe software for clinical note transcription with the patient, who gave verbal consent to proceed.   Independent interpretation of notes and tests performed by another provider:   None  Procedures performed:   None  Pertinent History, Exam, Impression, and Recommendations:   Problem List Items Addressed This Visit     Encounter for weight management   Relevant Medications   buPROPion  (WELLBUTRIN  XL) 300 MG 24 hr tablet   topiramate  (TOPAMAX ) 25 MG capsule   Obesity, class 2 - Primary   History of Present Illness Rebekah Parks is a 33 year old female who presents for weight management follow-up.  Weight management and dietary habits - Significant weight loss since 2013, currently at lowest recorded weight in the system - Recent slight weight loss, dropping a few pounds from 211 lbs after a plateau about one month ago - Stable dose of metformin  without recent dose increase - Current eating pattern consists of primarily one meal per day, usually dinner - No daytime hunger; typically consumes coffee with sugar-free creamer in the morning - Avoids overeating due to feeling sick when consuming too much food - Recent vacation included increased intake of sugary drinks, but has since  resumed healthier dietary habits - Focused on incorporating more nutritious foods despite family's picky eating habits  Physical activity limitations - Acknowledges need for increased physical activity but 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 Plateau noted. Discussed dietary management, physical activity, and potential overtraining syndrome. - Continue metformin . - Encourage regular physical activity, focus on movement. - Monitor nutrition, especially with increased activity. - Consider topiramate  if weight loss plateaus. - Follow-up in February for physical and weight check.      Relevant Medications   buPROPion  (WELLBUTRIN  XL) 300 MG 24 hr tablet   topiramate  (TOPAMAX ) 25 MG capsule     Orders & Medications Medications:  Meds ordered this encounter  Medications   buPROPion  (WELLBUTRIN  XL) 300 MG 24 hr tablet    Sig: Take 1 tablet (300 mg total) by mouth daily.    Dispense:  90 tablet    Refill:  0   topiramate  (TOPAMAX ) 25 MG capsule    Sig: Take 1 capsule (25 mg total) by mouth 2 (two) times daily.    Dispense:  180 capsule    Refill:  0   metFORMIN  (GLUCOPHAGE -XR) 500 MG 24 hr tablet    Sig: Take 1 tablet (500 mg total) by mouth 2 (  two) times daily with a meal.    Dispense:  180 tablet    Refill:  0   No orders of the defined types were placed in this encounter.    Return in about 5 months (around 07/24/2024) for CPE.     Selinda JINNY Ku, MD, [facility]   Primary Care Sports Medicine Primary Care and Sports Medicine at [facility]

## 2024-03-07 NOTE — Assessment & Plan Note (Signed)
 History of Present Illness Rebekah Parks is a 33 year old female who presents for weight management follow-up.  Weight management and dietary habits - Significant weight loss since 2013, currently at lowest recorded weight in the system - Recent slight weight loss, dropping a few pounds from 211 lbs after a plateau about one month ago - Stable dose of metformin  without recent dose increase - Current eating pattern consists of primarily one meal per day, usually dinner - No daytime hunger; typically consumes coffee with sugar-free creamer in the morning - Avoids overeating due to feeling sick when consuming too much food - Recent vacation included increased intake of sugary drinks, but has since resumed healthier dietary habits - Focused on incorporating more nutritious foods despite family's picky eating habits  Physical activity limitations - Acknowledges need for increased physical activity but 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 Plateau noted. Discussed dietary management, physical activity, and potential overtraining syndrome. - Continue metformin . - Encourage regular physical activity, focus on movement. - Monitor nutrition, especially with increased activity. - Consider topiramate  if weight loss plateaus. - Follow-up in February for physical and weight check.

## 2024-03-07 NOTE — Patient Instructions (Signed)
 VISIT SUMMARY:  Today, we discussed your progress with weight management. You have achieved significant weight loss and are currently at your lowest recorded weight. We also talked about your dietary habits, physical activity, and sleep-disordered breathing management.  YOUR PLAN:  WEIGHT MANAGEMENT: You have achieved weight loss with medication and lifestyle changes. -Continue taking metformin  as prescribed. -Try to engage in regular physical activity, focusing on movement that fits into your daily routine. -Monitor your nutrition, especially as you increase your activity levels. -If your weight loss plateaus again, we may consider adding topiramate . -Schedule a follow-up appointment in February for a physical and weight check.

## 2024-03-27 ENCOUNTER — Encounter: Payer: Self-pay | Admitting: Sleep Medicine

## 2024-03-27 ENCOUNTER — Ambulatory Visit: Admitting: Sleep Medicine

## 2024-03-27 VITALS — BP 120/70 | HR 89 | Temp 98.2°F | Ht 65.0 in | Wt 209.0 lb

## 2024-03-27 DIAGNOSIS — G4733 Obstructive sleep apnea (adult) (pediatric): Secondary | ICD-10-CM

## 2024-03-27 DIAGNOSIS — E66812 Obesity, class 2: Secondary | ICD-10-CM

## 2024-03-27 DIAGNOSIS — Z6834 Body mass index (BMI) 34.0-34.9, adult: Secondary | ICD-10-CM | POA: Diagnosis not present

## 2024-03-27 NOTE — Patient Instructions (Signed)

## 2024-03-27 NOTE — Progress Notes (Signed)
 Name:Rebekah Parks MRN: [id number] DOB: [birth year]   CHIEF COMPLAINT:  CPAP F/U   HISTORY OF PRESENT ILLNESS:  Rebekah Parks is a 33 y.o. w/ a h/o OSA and obesity who presents for CPAP F/U visit. Reports using CPAP therapy every night, which is confirmed by compliance data. Reports feeling more refreshed upon awakening with CPAP therapy. She is currently using the Airfit P30i nasal mask, which is comfortable.    EPWORTH SLEEP SCORE 10    08/01/2023    2:00 PM  Results of the Epworth flowsheet  Sitting and reading 3  Watching TV 2  Sitting, inactive in a public place (e.g. a theatre or a meeting) 0  As a passenger in a car for an hour without a break 1  Lying down to rest in the afternoon when circumstances permit 3  Sitting and talking to someone 0  Sitting quietly after a lunch without alcohol 1  In a car, while stopped for a few minutes in traffic 0  Total score 10    PAST MEDICAL HISTORY :   has a past medical history of Blood type, Rh negative (02/15/2016), Chronic back pain, Family history of coarctation of aorta (12/28/2015), and GBS (group B Streptococcus carrier), +RV culture, currently pregnant (06/21/2016).  has a past surgical history that includes Wisdom tooth extraction. Prior to Admission medications   Medication Sig Start Date End Date Taking? Authorizing Provider  metFORMIN  (GLUCOPHAGE -XR) 500 MG 24 hr tablet Take 500 mg by mouth 2 (two) times daily. 12/06/23  Yes [provider]  ACETYLCYSTEINE PO Take by mouth.  Otc supplement    [provider]  aspirin EC 81 MG tablet Take 81 mg by mouth daily. Swallow whole.    [provider]  buPROPion  (WELLBUTRIN  XL) 300 MG 24 hr tablet Take 1 tablet (300 mg total) by mouth daily. 12/05/23   Matthews, Jason J, MD  diclofenac  (VOLTAREN ) 50 MG EC tablet Take 1 tablet (50 mg total) by mouth 2 (two) times daily as needed. 07/24/23   Matthews, Jason J, MD  Menaquinone-7 (VITAMIN K2 PO) Take 1  tablet by mouth daily.    [provider]  OLIVE LEAF EXTRACT PO Take 500 mg by mouth daily.    [provider]  Psyllium Husk POWD by Does not apply route.    [provider]  SUBOXONE 8-2 MG FILM Place under the tongue 2 (two) times daily. 09/01/21   [provider]  Thiamine HCl (VITAMIN B1 PO) Take 1 tablet by mouth daily.    [provider]  topiramate  (TOPAMAX ) 25 MG capsule Take 1 capsule (25 mg total) by mouth 2 (two) times daily. 12/05/23   Matthews, Jason J, MD  UNABLE TO FIND Take 1 tablet by mouth daily. Med Name: beet supplement    [provider]   No Known Allergies  FAMILY HISTORY:  family history includes Breast cancer in her maternal grandmother; Healthy in her father and mother. SOCIAL HISTORY:  reports that she has quit smoking. Her smoking use included cigarettes. She has a 5 pack-year smoking history. She has never used smokeless tobacco. She reports that she does not currently use drugs after having used the following drugs: Marijuana. She reports that she does not drink alcohol.   Review of Systems:  Gen:  Denies  fever, sweats, chills weight loss  HEENT: Denies blurred vision, double vision, ear pain, eye pain, hearing loss, nose bleeds, sore throat Cardiac:  No  dizziness, chest pain or heaviness, chest tightness,edema, No JVD Resp:   No cough, -sputum production, -shortness of breath,-wheezing, -hemoptysis,  Gi: Denies swallowing difficulty, stomach pain, nausea or vomiting, diarrhea, constipation, bowel incontinence Gu:  Denies bladder incontinence, burning urine Ext:   Denies Joint pain, stiffness or swelling Skin: Denies  skin rash, easy bruising or bleeding or hives Endoc:  Denies polyuria, polydipsia , polyphagia or weight change Psych:   Denies depression, insomnia or hallucinations  Other:  All other systems negative  VITAL SIGNS: BP 120/70   Pulse 89   Temp 98.2 F (36.8 C)   Ht 5' 5 (1.651 m)    Wt 209 lb (94.8 kg)   LMP 03/05/2024 (Within Weeks)   SpO2 98%   BMI 34.78 kg/m     Physical Examination:   General Appearance: No distress  EYES PERRLA, EOM intact.   NECK Supple, No JVD Pulmonary: normal breath sounds, No wheezing.  CardiovascularNormal S1,S2.  No m/r/g.   Abdomen: Benign, Soft, non-tender. Skin:   warm, no rashes, no ecchymosis  Extremities: normal, no cyanosis, clubbing. Neuro:without focal findings,  speech normal  PSYCHIATRIC: Mood, affect within normal limits.   ASSESSMENT AND PLAN  OSA Patient is using and benefiting from CPAP therapy. Discussed the consequences of untreated sleep apnea. Advised not to drive drowsy for safety of patient and others. Will follow up in 6 months.    Obesity Counseled patient on diet and lifestyle modification.    Patient  satisfied with Plan of action and management. All questions answered  I spent a total of 24 minutes reviewing chart data, face-to-face evaluation with the patient, counseling and coordination of care as detailed above.    Hannahgrace Lalli, M.D.  Sleep Medicine [facility]

## 2024-04-25 ENCOUNTER — Encounter: Payer: Self-pay | Admitting: Sleep Medicine

## 2024-04-25 NOTE — Telephone Encounter (Signed)
 I received a message from [place] with [facility], [place]   This patient received their machine in May. We are in need of a renewed CMN to send the requested supplies.  Reviewed the one in [employer] and it looks like the renewal date was entered in for December instead of October. Correction has been sent. Once received, we'll get the supplies shipped.  I sent Glade a message letting her know Dr. Jess is not in [employer] and the CMN should be faxed to [phone]

## 2024-04-25 NOTE — Telephone Encounter (Signed)
 We have received the CMN for Dr. Jess to sign. I gave it to Zea to give to Dr. Jess

## 2024-04-25 NOTE — Telephone Encounter (Signed)
 I receive another message from [place] with [facility], [place]   I'll update the physician and fax it for Dr. Jess.  It looks like the supply team had Dr. Harl.

## 2024-04-25 NOTE — Telephone Encounter (Signed)
 I have sent urgent message to [facility] asking them to check on this issue

## 2024-06-19 ENCOUNTER — Ambulatory Visit: Admitting: Sleep Medicine

## 2024-07-25 ENCOUNTER — Encounter: Admitting: Family Medicine

## 2024-09-09 ENCOUNTER — Ambulatory Visit: Admitting: Sleep Medicine
